# Patient Record
Sex: Female | Born: 1990 | Race: Black or African American | Hispanic: No | Marital: Single | Smoking: Current some day smoker
Health system: Southern US, Community
[De-identification: ages and names within clinical notes are randomized; demographics above are authoritative.]

## PROBLEM LIST (undated history)

## (undated) HISTORY — PX: MOUTH SURGERY: SHX715

---

## 2011-12-25 ENCOUNTER — Encounter (HOSPITAL_COMMUNITY): Payer: Self-pay | Admitting: Emergency Medicine

## 2011-12-25 ENCOUNTER — Emergency Department (HOSPITAL_COMMUNITY)
Admission: EM | Admit: 2011-12-25 | Discharge: 2011-12-25 | Disposition: A | Discharge status: Home / Self Care | Payer: No Typology Code available for payment source | Attending: Emergency Medicine | Admitting: Emergency Medicine

## 2011-12-25 ENCOUNTER — Emergency Department (HOSPITAL_COMMUNITY): Payer: No Typology Code available for payment source

## 2011-12-25 DIAGNOSIS — R51 Headache: Secondary | ICD-10-CM | POA: Insufficient documentation

## 2011-12-25 DIAGNOSIS — S0083XA Contusion of other part of head, initial encounter: Secondary | ICD-10-CM

## 2011-12-25 DIAGNOSIS — S339XXA Sprain of unspecified parts of lumbar spine and pelvis, initial encounter: Secondary | ICD-10-CM | POA: Insufficient documentation

## 2011-12-25 DIAGNOSIS — S0003XA Contusion of scalp, initial encounter: Secondary | ICD-10-CM | POA: Insufficient documentation

## 2011-12-25 DIAGNOSIS — M26629 Arthralgia of temporomandibular joint, unspecified side: Secondary | ICD-10-CM | POA: Insufficient documentation

## 2011-12-25 DIAGNOSIS — Z79899 Other long term (current) drug therapy: Secondary | ICD-10-CM | POA: Insufficient documentation

## 2011-12-25 DIAGNOSIS — M25569 Pain in unspecified knee: Secondary | ICD-10-CM | POA: Insufficient documentation

## 2011-12-25 DIAGNOSIS — S8000XA Contusion of unspecified knee, initial encounter: Secondary | ICD-10-CM | POA: Insufficient documentation

## 2011-12-25 DIAGNOSIS — S39012A Strain of muscle, fascia and tendon of lower back, initial encounter: Secondary | ICD-10-CM

## 2011-12-25 DIAGNOSIS — F17200 Nicotine dependence, unspecified, uncomplicated: Secondary | ICD-10-CM | POA: Insufficient documentation

## 2011-12-25 MED ORDER — NAPROXEN 500 MG PO TABS: 500.0000 mg | ORAL_TABLET | Freq: Two times a day (BID) | ORAL | Status: AC

## 2011-12-25 MED ORDER — IBUPROFEN 800 MG PO TABS
800.0000 mg | ORAL_TABLET | Freq: Once | ORAL | Status: AC
  Administered 2011-12-25: 800 mg via ORAL
  Filled 2011-12-25: qty 1

## 2011-12-25 MED ORDER — CYCLOBENZAPRINE HCL 10 MG PO TABS: 10.0000 mg | ORAL_TABLET | Freq: Two times a day (BID) | ORAL | Status: AC | PRN

## 2011-12-25 MED ORDER — OXYCODONE-ACETAMINOPHEN 5-325 MG PO TABS: 1.0000 | ORAL_TABLET | Freq: Four times a day (QID) | ORAL | Status: AC | PRN

## 2011-12-25 MED ORDER — OXYCODONE-ACETAMINOPHEN 5-325 MG PO TABS
2.0000 | ORAL_TABLET | Freq: Once | ORAL | Status: AC
  Administered 2011-12-25: 2 via ORAL
  Filled 2011-12-25: qty 2
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
## 2011-12-25 NOTE — ED Notes (Signed)
Dr. King at pt bedside.  

## 2013-11-17 ENCOUNTER — Emergency Department (HOSPITAL_COMMUNITY): Payer: No Typology Code available for payment source

## 2013-11-17 ENCOUNTER — Encounter (HOSPITAL_COMMUNITY): Payer: Self-pay | Admitting: Emergency Medicine

## 2013-11-17 ENCOUNTER — Emergency Department (HOSPITAL_COMMUNITY)
Admission: EM | Admit: 2013-11-17 | Discharge: 2013-11-17 | Disposition: A | Discharge status: Home / Self Care | Payer: No Typology Code available for payment source | Attending: Emergency Medicine | Admitting: Emergency Medicine

## 2013-11-17 DIAGNOSIS — S21109A Unspecified open wound of unspecified front wall of thorax without penetration into thoracic cavity, initial encounter: Secondary | ICD-10-CM | POA: Insufficient documentation

## 2013-11-17 DIAGNOSIS — S21222D Laceration with foreign body of left back wall of thorax without penetration into thoracic cavity, subsequent encounter: Secondary | ICD-10-CM

## 2013-11-17 DIAGNOSIS — S0993XA Unspecified injury of face, initial encounter: Secondary | ICD-10-CM | POA: Insufficient documentation

## 2013-11-17 DIAGNOSIS — S335XXA Sprain of ligaments of lumbar spine, initial encounter: Secondary | ICD-10-CM | POA: Insufficient documentation

## 2013-11-17 DIAGNOSIS — S41009A Unspecified open wound of unspecified shoulder, initial encounter: Secondary | ICD-10-CM | POA: Insufficient documentation

## 2013-11-17 DIAGNOSIS — S199XXA Unspecified injury of neck, initial encounter: Secondary | ICD-10-CM

## 2013-11-17 DIAGNOSIS — Z23 Encounter for immunization: Secondary | ICD-10-CM | POA: Insufficient documentation

## 2013-11-17 DIAGNOSIS — S21119A Laceration without foreign body of unspecified front wall of thorax without penetration into thoracic cavity, initial encounter: Secondary | ICD-10-CM

## 2013-11-17 DIAGNOSIS — S21009A Unspecified open wound of unspecified breast, initial encounter: Secondary | ICD-10-CM | POA: Insufficient documentation

## 2013-11-17 DIAGNOSIS — F17200 Nicotine dependence, unspecified, uncomplicated: Secondary | ICD-10-CM | POA: Insufficient documentation

## 2013-11-17 DIAGNOSIS — S39012A Strain of muscle, fascia and tendon of lower back, initial encounter: Secondary | ICD-10-CM

## 2013-11-17 DIAGNOSIS — S21209A Unspecified open wound of unspecified back wall of thorax without penetration into thoracic cavity, initial encounter: Secondary | ICD-10-CM | POA: Insufficient documentation

## 2013-11-17 MED ORDER — TETANUS-DIPHTH-ACELL PERTUSSIS 5-2.5-18.5 LF-MCG/0.5 IM SUSP
0.5000 mL | Freq: Once | INTRAMUSCULAR | Status: AC
  Administered 2013-11-17: 0.5 mL via INTRAMUSCULAR
  Filled 2013-11-17: qty 0.5

## 2013-11-17 MED ORDER — IBUPROFEN 800 MG PO TABS: 800.0000 mg | ORAL_TABLET | Freq: Three times a day (TID) | ORAL | Status: DC

## 2013-11-17 MED ORDER — ACETAMINOPHEN 325 MG PO TABS
650.0000 mg | ORAL_TABLET | Freq: Once | ORAL | Status: AC
  Administered 2013-11-17: 650 mg via ORAL
  Filled 2013-11-17: qty 2

## 2013-11-17 NOTE — Discharge Instructions (Signed)
Laceration Care, Adult °A laceration is a cut or lesion that goes through all layers of the skin and into the tissue just beneath the skin. °TREATMENT  °Some lacerations may not require closure. Some lacerations may not be able to be closed due to an increased risk of infection. It is important to see your caregiver as soon as possible after an injury to minimize the risk of infection and maximize the opportunity for successful closure. °If closure is appropriate, pain medicines may be given, if needed. The wound will be cleaned to help prevent infection. Your caregiver will use stitches (sutures), staples, wound glue (adhesive), or skin adhesive strips to repair the laceration. These tools bring the skin edges together to allow for faster healing and a better cosmetic outcome. However, all wounds will heal with a scar. Once the wound has healed, scarring can be minimized by covering the wound with sunscreen during the day for 1 full year. °HOME CARE INSTRUCTIONS  °For sutures or staples: °· Keep the wound clean and dry. °· If you were given a bandage (dressing), you should change it at least once a day. Also, change the dressing if it becomes wet or dirty, or as directed by your caregiver. °· Wash the wound with soap and water 2 times a day. Rinse the wound off with water to remove all soap. Pat the wound dry with a clean towel. °· After cleaning, apply a thin layer of the antibiotic ointment as recommended by your caregiver. This will help prevent infection and keep the dressing from sticking. °· You may shower as usual after the first 24 hours. Do not soak the wound in water until the sutures are removed. °· Only take over-the-counter or prescription medicines for pain, discomfort, or fever as directed by your caregiver. °· Get your sutures or staples removed as directed by your caregiver. °For skin adhesive strips: °· Keep the wound clean and dry. °· Do not get the skin adhesive strips wet. You may bathe  carefully, using caution to keep the wound dry. °· If the wound gets wet, pat it dry with a clean towel. °· Skin adhesive strips will fall off on their own. You may trim the strips as the wound heals. Do not remove skin adhesive strips that are still stuck to the wound. They will fall off in time. °For wound adhesive: °· You may briefly wet your wound in the shower or bath. Do not soak or scrub the wound. Do not swim. Avoid periods of heavy perspiration until the skin adhesive has fallen off on its own. After showering or bathing, gently pat the wound dry with a clean towel. °· Do not apply liquid medicine, cream medicine, or ointment medicine to your wound while the skin adhesive is in place. This may loosen the film before your wound is healed. °· If a dressing is placed over the wound, be careful not to apply tape directly over the skin adhesive. This may cause the adhesive to be pulled off before the wound is healed. °· Avoid prolonged exposure to sunlight or tanning lamps while the skin adhesive is in place. Exposure to ultraviolet light in the first year will darken the scar. °· The skin adhesive will usually remain in place for 5 to 10 days, then naturally fall off the skin. Do not pick at the adhesive film. °You may need a tetanus shot if: °· You cannot remember when you had your last tetanus shot. °· You have never had a tetanus   shot. If you get a tetanus shot, your arm may swell, get red, and feel warm to the touch. This is common and not a problem. If you need a tetanus shot and you choose not to have one, there is a rare chance of getting tetanus. Sickness from tetanus can be serious. SEEK MEDICAL CARE IF:   You have redness, swelling, or increasing pain in the wound.  You see a red line that goes away from the wound.  You have yellowish-white fluid (pus) coming from the wound.  You have a fever.  You notice a bad smell coming from the wound or dressing.  Your wound breaks open before or  after sutures have been removed.  You notice something coming out of the wound such as wood or glass.  Your wound is on your hand or foot and you cannot move a finger or toe. SEEK IMMEDIATE MEDICAL CARE IF:   Your pain is not controlled with prescribed medicine.  You have severe swelling around the wound causing pain and numbness or a change in color in your arm, hand, leg, or foot.  Your wound splits open and starts bleeding.  You have worsening numbness, weakness, or loss of function of any joint around or beyond the wound.  You develop painful lumps near the wound or on the skin anywhere on your body. MAKE SURE YOU:   Understand these instructions.  Will watch your condition.  Will get help right away if you are not doing well or get worse. Document Released: 10/31/2005 Document Revised: 01/23/2012 Document Reviewed: 04/26/2011 [facility] Patient Information 2014 [place], [place].  Lumbosacral Strain Lumbosacral strain is one of the most common causes of back pain. There are many causes of back pain. Most are not serious conditions. CAUSES  Your backbone (spinal column) is made up of 24 main vertebral bodies, the sacrum, and the coccyx. These are held together by muscles and tough, fibrous tissue (ligaments). Nerve roots pass through the openings between the vertebrae. A sudden move or injury to the back may cause injury to, or pressure on, these nerves. This may result in localized back pain or pain movement (radiation) into the buttocks, down the leg, and into the foot. Sharp, shooting pain from the buttock down the back of the leg (sciatica) is frequently associated with a ruptured (herniated) disk. Pain may be caused by muscle spasm alone. Your caregiver can often find the cause of your pain by the details of your symptoms and an exam. In some cases, you may need tests (such as X-rays). Your caregiver will work with you to decide if any tests are needed based on your specific  exam. HOME CARE INSTRUCTIONS   Avoid an underactive lifestyle. Active exercise, as directed by your caregiver, is your greatest weapon against back pain.  Avoid hard physical activities (tennis, racquetball, waterskiing) if you are not in proper physical condition for it. This may aggravate or create problems.  If you have a back problem, avoid sports requiring sudden body movements. Swimming and walking are generally safer activities.  Maintain good posture.  Avoid becoming overweight (obese).  Use bed rest for only the most extreme, sudden (acute) episode. Your caregiver will help you determine how much bed rest is necessary.  For acute conditions, you may put ice on the injured area.  Put ice in a plastic bag.  Place a towel between your skin and the bag.  Leave the ice on for 15-20 minutes at a time, every 2 hours,  or as needed.  After you are improved and more active, it may help to apply heat for 30 minutes before activities. See your caregiver if you are having pain that lasts longer than expected. Your caregiver can advise appropriate exercises or therapy if needed. With conditioning, most back problems can be avoided. SEEK IMMEDIATE MEDICAL CARE IF:   You have numbness, tingling, weakness, or problems with the use of your arms or legs.  You experience severe back pain not relieved with medicines.  There is a change in bowel or bladder control.  You have increasing pain in any area of the body, including your belly (abdomen).  You notice shortness of breath, dizziness, or feel faint.  You feel sick to your stomach (nauseous), are throwing up (vomiting), or become sweaty.  You notice discoloration of your toes or legs, or your feet get very cold.  Your back pain is getting worse.  You have a fever. MAKE SURE YOU:   Understand these instructions.  Will watch your condition.  Will get help right away if you are not doing well or get worse. Document Released:  08/10/2005 Document Revised: 01/23/2012 Document Reviewed: 01/30/2009 [facility] Patient Information 2014 [place] [employer]
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
## 2013-11-30 NOTE — ED Provider Notes (Signed)
Medical screening examination/treatment/procedure(s) were performed by non-physician practitioner and as supervising physician I was immediately available for consultation/collaboration.  EKG Interpretation   None         Twana Wileman, MD 11/30/13 0647 

## 2014-11-25 ENCOUNTER — Encounter (HOSPITAL_COMMUNITY): Payer: Self-pay | Admitting: Emergency Medicine

## 2014-11-25 ENCOUNTER — Emergency Department (HOSPITAL_COMMUNITY)
Admission: EM | Admit: 2014-11-25 | Discharge: 2014-11-25 | Disposition: A | Discharge status: Home / Self Care | Payer: No Typology Code available for payment source | Attending: Emergency Medicine | Admitting: Emergency Medicine

## 2014-11-25 DIAGNOSIS — M5442 Lumbago with sciatica, left side: Secondary | ICD-10-CM

## 2014-11-25 DIAGNOSIS — Z720 Tobacco use: Secondary | ICD-10-CM | POA: Insufficient documentation

## 2014-11-25 DIAGNOSIS — Z9889 Other specified postprocedural states: Secondary | ICD-10-CM | POA: Insufficient documentation

## 2014-11-25 MED ORDER — CYCLOBENZAPRINE HCL 10 MG PO TABS: 10.0000 mg | ORAL_TABLET | Freq: Two times a day (BID) | ORAL | Status: DC | PRN

## 2014-11-25 MED ORDER — IBUPROFEN 800 MG PO TABS: 800.0000 mg | ORAL_TABLET | Freq: Three times a day (TID) | ORAL | Status: DC

## 2014-11-25 NOTE — ED Provider Notes (Signed)
CSN: [id number]     Arrival date & time 11/25/14  1309 History  This chart was scribed for non-physician practitioner, Ladona MowJoe Marthe Dant, PA-C working with Ethelda ChickMartha K Linker, MD by Greggory StallionKayla Andersen, ED scribe. This patient was seen in room WTR7/WTR7 and the patient's care was started at 2:23 PM.   Chief Complaint  Patient presents with  . Back Pain   The history is provided by the patient. No language interpreter was used.    HPI Comments: Anita Rush is a 24 y.o. female with history of slipped disc who presents to the Emergency Department complaining of worsening lower back pain with associated tingling into bilateral legs that started 3 months ago. States she was diagnosed with a slipped disc one year ago and has intermittent pain since. Denies recent injury but states she works 2 jobs and has to be on her feet a lot. certain movements worsen pain. Denies fever, bowel or bladder incontinence, saddle anesthesia, numbness, weakness. Denies history of cancer or IV drug use.   History reviewed. No pertinent past medical history. Past Surgical History  Procedure Laterality Date  . Mouth surgery     No family history on file. History  Substance Use Topics  . Smoking status: Current Every Day Smoker -- 0.50 packs/day  . Smokeless tobacco: Not on file  . Alcohol Use: No   OB History    No data available     Review of Systems  Constitutional: Negative for fever.  Genitourinary:       Negative for bowel or bladder incontinence.  Musculoskeletal: Positive for back pain.  Neurological: Negative for weakness and numbness.  All other systems reviewed and are negative.  Allergies  Review of patient's allergies indicates no known allergies.  Home Medications   Prior to Admission medications   Medication Sig Start Date End Date Taking? Authorizing Provider  cyclobenzaprine (FLEXERIL) 10 MG tablet Take 1 tablet (10 mg total) by mouth 2 (two) times daily as needed for muscle spasms. 11/25/14   Monte FantasiaJoseph W  Marcele Kosta, PA-C  ibuprofen (ADVIL,MOTRIN) 800 MG tablet Take 1 tablet (800 mg total) by mouth 3 (three) times daily. 11/25/14   Monte FantasiaJoseph W Cerena Baine, PA-C   BP 125/73 mmHg  Pulse 94  Temp(Src) 98 F (36.7 C) (Oral)  Resp 20  SpO2 97%  LMP 11/17/2014 (Exact Date)   Physical Exam  Constitutional: She is oriented to person, place, and time. She appears well-developed and well-nourished. No distress.  HENT:  Head: Normocephalic and atraumatic.  Eyes: Conjunctivae and EOM are normal.  Neck: Neck supple. No tracheal deviation present.  Cardiovascular: Normal rate.   Pulmonary/Chest: Effort normal. No respiratory distress.  Musculoskeletal: Normal range of motion.  Tenderness to palpation of left SI joint region.   Neurological: She is alert and oriented to person, place, and time. She has normal strength. No cranial nerve deficit or sensory deficit. She displays a negative Romberg sign. Gait normal. GCS eye subscore is 4. GCS verbal subscore is 5. GCS motor subscore is 6.  Patient fully alert answering questions appropriately in full, clear sentences. Cranial nerve II through XII grossly intact. Motor strength 5/5 in bilateral upper and lower extremities. Distal sensation intact.  Skin: Skin is warm and dry.  Psychiatric: She has a normal mood and affect. Her behavior is normal.  Nursing note and vitals reviewed.  ED Course  Procedures (including critical care time)  DIAGNOSTIC STUDIES: Oxygen Saturation is 97% on RA, normal by my interpretation.    COORDINATION  OF CARE: 2:28 PM-Discussed treatment plan which includes 800 mg ibuprofen, a muscle relaxer, back exercises and heating pad with pt at bedside and pt agreed to plan. Return precautions given.  Labs Review Labs Reviewed - No data to display  Imaging Review No results found.   EKG Interpretation None      MDM   Final diagnoses:  Left-sided low back pain with left-sided sciatica    Patient with back pain.  No neurological  deficits and normal neuro exam.  Patient can walk but states is painful.  No loss of bowel or bladder control.  No concern for cauda equina.  No fever, night sweats, weight loss, h/o cancer, IVDU.  RICE protocol and pain medicine indicated and discussed with patient. I discussed return precautions with patient, and strongly encouraged her to follow-up with primary care physician. Patient was understanding and agreement of this plan. I encouraged patient to call or return to the ER should she have any questions or concerns.  I personally performed the services described in this documentation, which was scribed in my presence. The recorded information has been reviewed and is accurate.  BP 125/73 mmHg  Pulse 94  Temp(Src) 98 F (36.7 C) (Oral)  Resp 20  SpO2 97%  LMP 11/17/2014 (Exact Date)  Signed,  Ladona Mow, PA-C 9:49 PM   Monte Fantasia, PA-C 11/25/14 2149  Ethelda Chick, MD 11/28/14 [phone]

## 2014-11-25 NOTE — Discharge Instructions (Signed)
Sciatica °Sciatica is pain, weakness, numbness, or tingling along the path of the sciatic nerve. The nerve starts in the lower back and runs down the back of each leg. The nerve controls the muscles in the lower leg and in the back of the knee, while also providing sensation to the back of the thigh, lower leg, and the sole of your foot. Sciatica is a symptom of another medical condition. For instance, nerve damage or certain conditions, such as a herniated disk or bone spur on the spine, pinch or put pressure on the sciatic nerve. This causes the pain, weakness, or other sensations normally associated with sciatica. Generally, sciatica only affects one side of the body. °CAUSES  °· Herniated or slipped disc. °· Degenerative disk disease. °· A pain disorder involving the narrow muscle in the buttocks (piriformis syndrome). °· Pelvic injury or fracture. °· Pregnancy. °· Tumor (rare). °SYMPTOMS  °Symptoms can vary from mild to very severe. The symptoms usually travel from the low back to the buttocks and down the back of the leg. Symptoms can include: °· Mild tingling or dull aches in the lower back, leg, or hip. °· Numbness in the back of the calf or sole of the foot. °· Burning sensations in the lower back, leg, or hip. °· Sharp pains in the lower back, leg, or hip. °· Leg weakness. °· Severe back pain inhibiting movement. °These symptoms may get worse with coughing, sneezing, laughing, or prolonged sitting or standing. Also, being overweight may worsen symptoms. °DIAGNOSIS  °Your caregiver will perform a physical exam to look for common symptoms of sciatica. He or she may ask you to do certain movements or activities that would trigger sciatic nerve pain. Other tests may be performed to find the cause of the sciatica. These may include: °· Blood tests. °· X-rays. °· Imaging tests, such as an MRI or CT scan. °TREATMENT  °Treatment is directed at the cause of the sciatic pain. Sometimes, treatment is not necessary  and the pain and discomfort goes away on its own. If treatment is needed, your caregiver may suggest: °· Over-the-counter medicines to relieve pain. °· Prescription medicines, such as anti-inflammatory medicine, muscle relaxants, or narcotics. °· Applying heat or ice to the painful area. °· Steroid injections to lessen pain, irritation, and inflammation around the nerve. °· Reducing activity during periods of pain. °· Exercising and stretching to strengthen your abdomen and improve flexibility of your spine. Your caregiver may suggest losing weight if the extra weight makes the back pain worse. °· Physical therapy. °· Surgery to eliminate what is pressing or pinching the nerve, such as a bone spur or part of a herniated disk. °HOME CARE INSTRUCTIONS  °· Only take over-the-counter or prescription medicines for pain or discomfort as directed by your caregiver. °· Apply ice to the affected area for 20 minutes, 3-4 times a day for the first 48-72 hours. Then try heat in the same way. °· Exercise, stretch, or perform your usual activities if these do not aggravate your pain. °· Attend physical therapy sessions as directed by your caregiver. °· Keep all follow-up appointments as directed by your caregiver. °· Do not wear high heels or shoes that do not provide proper support. °· Check your mattress to see if it is too soft. A firm mattress may lessen your pain and discomfort. °SEEK IMMEDIATE MEDICAL CARE IF:  °· You lose control of your bowel or bladder (incontinence). °· You have increasing weakness in the lower back, pelvis, buttocks,   or legs. °· You have redness or swelling of your back. °· You have a burning sensation when you urinate. °· You have pain that gets worse when you lie down or awakens you at night. °· Your pain is worse than you have experienced in the past. °· Your pain is lasting longer than 4 weeks. °· You are suddenly losing weight without reason. °MAKE SURE YOU: °· Understand these  instructions. °· Will watch your condition. °· Will get help right away if you are not doing well or get worse. °Document Released: 10/25/2001 Document Revised: 05/01/2012 Document Reviewed: 03/11/2012 °ExitCare® Patient Information ©2015 [employer]. This information is not intended to replace advice given to you by your health care provider. Make sure you discuss any questions you have with your health care provider. ° ° °Back Exercises °Back exercises help treat and prevent back injuries. The goal of back exercises is to increase the strength of your abdominal and back muscles and the flexibility of your back. These exercises should be started when you no longer have back pain. Back exercises include: °· Pelvic Tilt. Lie on your back with your knees bent. Tilt your pelvis until the lower part of your back is against the floor. Hold this position 5 to 10 sec and repeat 5 to 10 times. °· Knee to Chest. Pull first 1 knee up against your chest and hold for 20 to 30 seconds, repeat this with the other knee, and then both knees. This may be done with the other leg straight or bent, whichever feels better. °· Sit-Ups or Curl-Ups. Bend your knees 90 degrees. Start with tilting your pelvis, and do a partial, slow sit-up, lifting your trunk only 30 to 45 degrees off the floor. Take at least 2 to 3 seconds for each sit-up. Do not do sit-ups with your knees out straight. If partial sit-ups are difficult, simply do the above but with only tightening your abdominal muscles and holding it as directed. °· Hip-Lift. Lie on your back with your knees flexed 90 degrees. Push down with your feet and shoulders as you raise your hips a couple inches off the floor; hold for 10 seconds, repeat 5 to 10 times. °· Back arches. Lie on your stomach, propping yourself up on bent elbows. Slowly press on your hands, causing an arch in your low back. Repeat 3 to 5 times. Any initial stiffness and discomfort should lessen with repetition over  time. °· Shoulder-Lifts. Lie face down with arms beside your body. Keep hips and torso pressed to floor as you slowly lift your head and shoulders off the floor. °Do not overdo your exercises, especially in the beginning. Exercises may cause you some mild back discomfort which lasts for a few minutes; however, if the pain is more severe, or lasts for more than 15 minutes, do not continue exercises until you see your caregiver. Improvement with exercise therapy for back problems is slow.  °See your caregivers for assistance with developing a proper back exercise program. °Document Released: 12/08/2004 Document Revised: 01/23/2012 Document Reviewed: 09/01/2011 °ExitCare® Patient Information ©2015 [employer]. This information is not intended to replace advice given to you by your health care provider. Make sure you discuss any questions you have with your health care provider. ° ° °Emergency Department Resource Guide °1) Find a Doctor and Pay Out of Pocket °Although you won't have to find out who is covered by your insurance plan, it is a good idea to ask around and get recommendations. You   will then need to call the office and see if the doctor you have chosen will accept you as a new patient and what types of options they offer for patients who are self-pay. Some doctors offer discounts or will set up payment plans for their patients who do not have insurance, but you will need to ask so you aren't surprised when you get to your appointment. ° °2) Contact Your Local Health Department °Not all health departments have doctors that can see patients for sick visits, but many do, so it is worth a call to see if yours does. If you don't know where your local health department is, you can check in your phone book. The CDC also has a tool to help you locate your state's health department, and many state websites also have listings of all of their local health departments. ° °3) Find a Walk-in Clinic °If your illness is  not likely to be very severe or complicated, you may want to try a walk in clinic. These are popping up all over the country in pharmacies, drugstores, and shopping centers. They're usually staffed by nurse practitioners or physician assistants that have been trained to treat common illnesses and complaints. They're usually fairly quick and inexpensive. However, if you have serious medical issues or chronic medical problems, these are probably not your best option. ° °No Primary Care Doctor: °- Call [facility] at  832-8000 - they can help you locate a primary care doctor that  accepts your insurance, provides certain services, etc. °- Physician Referral Service- 1-800-533-3463 ° °Chronic Pain Problems: °Organization         Address  Phone   Notes  °[facility]  (336) 297-2271 Patients need to be referred by their primary care doctor.  ° °Medication Assistance: °Organization         Address  Phone   Notes  °[place] [facility] [address] °[place], [place] [address] (336) 641-8030 --Must be a resident of [place] °-- Must have NO insurance coverage whatsoever (no Medicaid/ Medicare, etc.) °-- The pt. MUST have a primary care doctor that directs their care regularly and follows them in the community °  °[facility]  (866) 331-1348   °[employer]  (888) 892-1162   ° °Agencies that provide inexpensive medical care: °Organization         Address  Phone   Notes  °[facility]  (336) 832-8035   °[facility]    (336) 832-7272   °[facility] [address] °[place], [place] [address] (336) 832-4777   °[facility] [address], °[place] (336) 271-4999   °[facility]    (336) 373-0678   °[facility]    (336) 272-1050   °[facility] ° [address], [place] Phone:  (336) 832-4444, Fax:  (336) 832-4440 Hours of Operation:  9 am - 6  pm, M-F.  Also accepts Medicaid/Medicare and self-pay.  °[facility] ° [address], [place] Phone: (336) 832-3150, Fax: (336) [phone]. Hours of Operation:  8:30 am - 5:30 pm, M-F.  Also accepts Medicaid and self-pay.  °[facility] [place] [address], [place] Phone: (336) 878-6027   °[facility] [address], [place], [place] (336)723-1848, Ext. 123 Mondays & Thursdays: 7-9 AM.  First 15 patients are seen on a first come, first   serve basis. °  ° °Medicaid-accepting [place] Providers: ° °Organization         Address  Phone   Notes  °[facility] [address], [place] (336) 641-2100 Also accepts self-pay patients.  °[facility] [address], [place] ° (336) 856-9996   °[facility] [address], [place] (336) 288-8857   °[facility] [address] [place] [address], [place] (336) 299-7000   °Veita Bland [address], [place]  ° (336) 373-1557 Only accepts [place] Access Medicaid patients after they have their name applied to their card.  ° °Self-Pay (no insurance) in [place]: ° °Organization         Address  Phone   Notes  °Sickle Cell Patients, [facility] [address], [place] (336) 832-1970   °[facility] [address], [place] (336) 832-4400   °[facility] [place] ° [address], [place] (336) 992-4800   °[facility]/Dr. Osei-Bonsu ° [address] [place] [address], [place] or [address], [place] (336) 841-8500 Phone number for both [place] and [place] locations is the same.  °[facility] [address], [place] (336) 299-0000   °[facility] [address] [place] [address], [place] or [address] (336) 852-7530 °(336) 878-2260   °[facility] [address], [place] (336) 350-1642, phone; (336) 294-5005, fax Sees patients 1st and 3rd Saturday of every month.  Must not qualify for public or private insurance (i.e. Medicaid, Medicare, [place] Health Choice, Veterans' Benefits) • Household income should be no more than 200% of the poverty level •The clinic cannot treat you if you are pregnant or think you are pregnant • Sexually transmitted diseases are not treated at the clinic.  ° ° °Dental Care: °Organization         Address  Phone  Notes  °[facility] [address], [place] (336) 641-6152 Accepts children up to age 21 who are enrolled in Medicaid or [place] Health Choice; pregnant women with a Medicaid card; and children who have applied for Medicaid or [place] Health Choice, but were declined, whose parents can pay a reduced fee at time of service.  °[facility] [place]  [address], [place] (336) 641-7733 Accepts children up to age 21 who are enrolled in Medicaid or [place] Health Choice; pregnant women with a Medicaid card; and children who have applied for Medicaid or [place] Health Choice, but were declined, whose parents can pay a reduced fee at time of service.  °[facility] ° [address], [place] (336) 641-4533 Patients are seen by appointment only. Walk-ins are not accepted. [facility] will see patients 18 years of age and older. °Monday - Tuesday (8am-5pm) °Most Wednesdays (8:30-5pm) °$30 per visit, cash only  °[facility] ° [address], [place] (336) 641-4533 Patients are seen by appointment only. Walk-ins are not accepted. [facility] will see patients 18 years of age and older. °One Wednesday Evening (Monthly: Volunteer Based).  $30 per visit, cash only  °[facility]  (919) 537-3737 for adults; Children under age 4, call Graduate Pediatric Dentistry at (919)  [phone]. Children aged 4-14, please call (  [phone]) [phone] to request a pediatric application. ° Dental services are provided in all areas of dental care including fillings, crowns and bridges, complete and partial dentures, implants, gum treatment, root canals, and extractions. Preventive care is also provided. Treatment is provided to both adults and children. °Patients are selected via a lottery and there is often a waiting list. °  °[facility] [address], °[place] ° (336) 763-8833 [email or web address] °  °[facility] [address], [place], [place] (336)723-1848, Ext. 123 Second and Fourth Thursday of each month, opens at 6:30 AM; Clinic ends at 9 AM.  Patients are seen on a first-come first-served basis, and a limited number are seen during each clinic.  ° °[facility] ° [address], [place], [place] (336) 723-7904   Eligibility Requirements °You must have lived in [place], or [place] counties for at least the last three months. °  You cannot be eligible for state or federal sponsored healthcare insurance, including Veterans Administration, Medicaid, or Medicare. °  You generally cannot be eligible for healthcare insurance through your employer.  °  How to apply: °Eligibility screenings are held every Tuesday and Wednesday afternoon from 1:00 pm until 4:00 pm. You do not need an appointment for the interview!  °[facility] [address], [place], [place] 336-631-2330   °[facility]  336-342-8273   °[facility]  336-703-3100   °[facility]  336-570-6415   ° °Behavioral Health Resources in the Community: °Intensive Outpatient Programs °Organization         Address  Phone  Notes  °[facility] [address], [place], [place] 336-878-6098   °[facility] [address], [place], [place] 336-832-9800   °[facility]  [address], [place], [place] ° 336-882-2125   °[facility] [address],  °[place], [place] 1-800-853-5163 or 336-641-4981   °Substance Abuse Resources °Organization         Address  Phone  Notes  °[facility]  336-882-2125   °[facility]  336-784-9470   °[facility]  336-285-9073   °[facility]  336-845-3988   °Residential & Outpatient Substance Abuse Program  1-800-659-3381   °Psychological Services °Organization         Address  Phone  Notes  °[facility]  336- 832-9600   °[facility]  336- 378-7881   °[facility] [address], [place] 1-800-853-5163 or 336-641-4981   ° °Mobile Crisis Teams °Organization         Address  Phone  Notes  °[facility], Mobile Crisis Care Unit  1-877-626-1772   °Assertive °[facility] ° [address] [place], [place] 336-834-9664   °Sharon DeEsch [address] °[place] 336-554-5454   ° °Self-Help/Support Groups °Organization         Address  Phone             Notes  °[facility] - variety of support groups  336- 373-1402 Call for more information  °Narcotics Anonymous (NA), [facility] [address], °[place]  2 meetings at this location  ° °Residential Treatment Programs °Organization         Address  Phone  Notes  °[facility] [address],    °   1-866-801-8205   °[facility] °   [address], [place], [place] 704-293-8524   °[facility] [address], [place] 336-845-3988 Admissions: 8am-3pm M-F  °[facility] [address]    °[place], [place] 336-841-1104   °[facility] [address], [place], [place] 336-379-7146   °[facility] [address]  °[place], [place] 336-285-9073   °[facility] Intensive Outpatient [address], [place], [place]  336-852-3033   °[facility] [address]  °[place], [place] 1-877-615-2722 or 336-784-9470   °[facility] [address] , [place] 336-227-7417 Accepts Medicaid  °[facility] [address]  °[place] 1-800-659-3381 Substance Abuse/Addiction Treatment  ° °[place] Behavioral Health Resources °Organization         Address  Phone  Notes  °[facility]  (888) 581-9988   °Julie Brannon, PhD [address] [place], [place]   (336) 349-5553 or (336) 951-0000   °[facility]   [address] °[place], [place] (336) 349-4454   °[facility] [address], [place], [place] (336) 342-8316 Insurance/Medicaid/sponsorship through [facility]  °[facility] [address]                                    [place], [place] (336) 342-8316 Therapy/tele-psych/case  °[facility] [address]  ° [place], [place] (336) 349-2233    °Dr. Arfeen  (336) 349-4544   °[facility] [place]  [employer] [place] [facility] 1) [address], [place] °2) [address], [place] °3)  [address], [place] (336) 349-3220 °(336) 342-7768 ° °(336) 342-8140   °[place] [facility] (336) 342-1394 or (336) 342-3537 (After Hours)    ° ° ° °

## 2014-11-25 NOTE — ED Notes (Signed)
Pt states she has slipped disc in back x 1 year and c/o back pain.

## 2014-12-10 ENCOUNTER — Encounter (HOSPITAL_COMMUNITY): Payer: Self-pay | Admitting: *Deleted

## 2014-12-10 ENCOUNTER — Emergency Department (HOSPITAL_COMMUNITY)
Admission: EM | Admit: 2014-12-10 | Discharge: 2014-12-10 | Disposition: A | Discharge status: Home / Self Care | Payer: No Typology Code available for payment source | Attending: Emergency Medicine | Admitting: Emergency Medicine

## 2014-12-10 ENCOUNTER — Emergency Department (HOSPITAL_COMMUNITY): Payer: No Typology Code available for payment source

## 2014-12-10 DIAGNOSIS — Y92410 Unspecified street and highway as the place of occurrence of the external cause: Secondary | ICD-10-CM | POA: Diagnosis not present

## 2014-12-10 DIAGNOSIS — Z3202 Encounter for pregnancy test, result negative: Secondary | ICD-10-CM | POA: Insufficient documentation

## 2014-12-10 DIAGNOSIS — Z720 Tobacco use: Secondary | ICD-10-CM | POA: Insufficient documentation

## 2014-12-10 DIAGNOSIS — Y9389 Activity, other specified: Secondary | ICD-10-CM | POA: Insufficient documentation

## 2014-12-10 DIAGNOSIS — Z791 Long term (current) use of non-steroidal anti-inflammatories (NSAID): Secondary | ICD-10-CM | POA: Insufficient documentation

## 2014-12-10 DIAGNOSIS — Z79899 Other long term (current) drug therapy: Secondary | ICD-10-CM | POA: Insufficient documentation

## 2014-12-10 DIAGNOSIS — S3991XA Unspecified injury of abdomen, initial encounter: Secondary | ICD-10-CM | POA: Insufficient documentation

## 2014-12-10 DIAGNOSIS — Y998 Other external cause status: Secondary | ICD-10-CM | POA: Diagnosis not present

## 2014-12-10 DIAGNOSIS — S199XXA Unspecified injury of neck, initial encounter: Secondary | ICD-10-CM | POA: Diagnosis not present

## 2014-12-10 LAB — POC URINE PREG, ED: Preg Test, Ur: NEGATIVE

## 2014-12-10 MED ORDER — CYCLOBENZAPRINE HCL 10 MG PO TABS: 10.0000 mg | ORAL_TABLET | Freq: Two times a day (BID) | ORAL | Status: DC | PRN

## 2014-12-10 MED ORDER — IBUPROFEN 800 MG PO TABS: 800.0000 mg | ORAL_TABLET | Freq: Three times a day (TID) | ORAL | Status: DC
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
## 2014-12-10 NOTE — Discharge Instructions (Signed)

## 2015-01-26 ENCOUNTER — Encounter (HOSPITAL_COMMUNITY): Payer: Self-pay | Admitting: Emergency Medicine

## 2015-01-26 DIAGNOSIS — R0789 Other chest pain: Secondary | ICD-10-CM | POA: Insufficient documentation

## 2015-01-26 DIAGNOSIS — Z720 Tobacco use: Secondary | ICD-10-CM | POA: Insufficient documentation

## 2015-01-26 DIAGNOSIS — Z3202 Encounter for pregnancy test, result negative: Secondary | ICD-10-CM | POA: Insufficient documentation

## 2015-01-26 DIAGNOSIS — M791 Myalgia: Secondary | ICD-10-CM | POA: Insufficient documentation

## 2015-01-26 DIAGNOSIS — R109 Unspecified abdominal pain: Secondary | ICD-10-CM | POA: Insufficient documentation

## 2015-01-26 NOTE — ED Notes (Signed)
Pt presents with right sided flank pain since Thursday, denies pain with urination or N/V.  Pt also c/o left sided chest pain that has been intermittent X 2 days, admits pain is worse when she takes a deep breath.  Respirations e/u at present, no distress noted.

## 2015-01-27 ENCOUNTER — Emergency Department (HOSPITAL_COMMUNITY): Payer: No Typology Code available for payment source

## 2015-01-27 ENCOUNTER — Emergency Department (HOSPITAL_COMMUNITY)
Admission: EM | Admit: 2015-01-27 | Discharge: 2015-01-27 | Disposition: A | Discharge status: Home / Self Care | Payer: No Typology Code available for payment source | Attending: Emergency Medicine | Admitting: Emergency Medicine

## 2015-01-27 DIAGNOSIS — M7918 Myalgia, other site: Secondary | ICD-10-CM

## 2015-01-27 DIAGNOSIS — R109 Unspecified abdominal pain: Secondary | ICD-10-CM

## 2015-01-27 DIAGNOSIS — R0789 Other chest pain: Secondary | ICD-10-CM

## 2015-01-27 LAB — BASIC METABOLIC PANEL
Anion gap: 6 (ref 5–15)
BUN: 12 mg/dL (ref 6–23)
CO2: 29 mmol/L (ref 19–32)
Calcium: 9 mg/dL (ref 8.4–10.5)
Chloride: 104 mmol/L (ref 96–112)
Creatinine, Ser: 0.84 mg/dL (ref 0.50–1.10)
GFR calc Af Amer: >90 mL/min (ref >90)
GFR calc non Af Amer: >90 mL/min (ref >90)
Glucose, Bld: 91 mg/dL (ref 70–99)
Potassium: 3.8 mmol/L (ref 3.5–5.1)
Sodium: 139 mmol/L (ref 135–145)

## 2015-01-27 LAB — CBC
HCT: 43.9 % (ref 36.0–46.0)
Hemoglobin: 14.7 g/dL (ref 12.0–15.0)
MCH: 30.6 pg (ref 26.0–34.0)
MCHC: 33.5 g/dL (ref 30.0–36.0)
MCV: 91.5 fL (ref 78.0–100.0)
Platelets: 311 10*3/uL (ref 150–400)
RBC: 4.8 MIL/uL (ref 3.87–5.11)
RDW: 12.6 % (ref 11.5–15.5)
WBC: 13.1 10*3/uL — ABNORMAL HIGH (ref 4.0–10.5)

## 2015-01-27 LAB — I-STAT TROPONIN, ED: Troponin i, poc: 0 ng/mL (ref 0.00–0.08)

## 2015-01-27 LAB — PREGNANCY, URINE: Preg Test, Ur: NEGATIVE

## 2015-01-27 LAB — URINALYSIS, ROUTINE W REFLEX MICROSCOPIC
Bilirubin Urine: NEGATIVE
Glucose, UA: NEGATIVE mg/dL
Hgb urine dipstick: NEGATIVE
Ketones, ur: NEGATIVE mg/dL
Leukocytes, UA: NEGATIVE
Nitrite: NEGATIVE
Protein, ur: NEGATIVE mg/dL
Specific Gravity, Urine: 1.025 (ref 1.005–1.030)
Urobilinogen, UA: 1 mg/dL (ref 0.0–1.0)
pH: 6.5 (ref 5.0–8.0)

## 2015-01-27 MED ORDER — NAPROXEN 500 MG PO TABS: 500.0000 mg | ORAL_TABLET | Freq: Two times a day (BID) | ORAL | Status: DC

## 2015-01-27 MED ORDER — NAPROXEN 250 MG PO TABS
500.0000 mg | ORAL_TABLET | Freq: Once | ORAL | Status: AC
  Administered 2015-01-27: 500 mg via ORAL
  Filled 2015-01-27: qty 2

## 2015-01-27 MED ORDER — TRAMADOL HCL 50 MG PO TABS
50.0000 mg | ORAL_TABLET | Freq: Once | ORAL | Status: AC
  Administered 2015-01-27: 50 mg via ORAL
  Filled 2015-01-27: qty 1

## 2015-01-27 MED ORDER — TRAMADOL HCL 50 MG PO TABS: 50.0000 mg | ORAL_TABLET | Freq: Four times a day (QID) | ORAL | Status: DC | PRN

## 2015-01-27 NOTE — ED Provider Notes (Signed)
CSN: [id number]     Arrival date & time 01/26/15  2333 History   First MD Initiated Contact with Patient 01/27/15 [phone]     Chief Complaint  Patient presents with  . Flank Pain  . Chest Pain     (Consider location/radiation/quality/duration/timing/severity/associated sxs/prior Treatment) HPI 24 yo female presents to the ER from home with complaint of right flank pain and left lower rib pain.  Flank pain started 5 days ago, rib pain 2 days ago.  Pain is constant, like a runner's cramp.  No new activity or known trauma.  Pt works in The[employer], occupational.  No chest pain, shortness of breath, fever, chills, nausea, vomiting, diarrhea.  No vaginal discharge or urinary symptoms.  Pt took ibuprofen once without improvement History reviewed. No pertinent past medical history. Past Surgical History  Procedure Laterality Date  . Mouth surgery     No family history on file. History  Substance Use Topics  . Smoking status: Current Every Day Smoker -- 0.50 packs/day  . Smokeless tobacco: Not on file  . Alcohol Use: No   OB History    No data available     Review of Systems  Cardiovascular: Positive for chest pain.  Genitourinary: Positive for flank pain.  All other systems reviewed and are negative.     Allergies  Review of patient's allergies indicates no known allergies.  Home Medications   Prior to Admission medications   Medication Sig Start Date End Date Taking? Authorizing Provider  naproxen (NAPROSYN) 500 MG tablet Take 1 tablet (500 mg total) by mouth 2 (two) times daily with a meal. 01/27/15   Marisa Severin, MD  traMADol (ULTRAM) 50 MG tablet Take 1 tablet (50 mg total) by mouth every 6 (six) hours as needed for moderate pain. 01/27/15   Marisa Severin, MD   BP 115/59 mmHg  Pulse 66  Temp(Src) 97.9 F (36.6 C) (Oral)  Resp 18  SpO2 100%  LMP 12/30/2014 Physical Exam  Constitutional: She is oriented to person, place, and time. She appears well-developed and well-nourished.   HENT:  Head: Normocephalic and atraumatic.  Nose: Nose normal.  Mouth/Throat: Oropharynx is clear and moist.  Eyes: Conjunctivae and EOM are normal. Pupils are equal, round, and reactive to light.  Neck: Normal range of motion. Neck supple. No JVD present. No tracheal deviation present. No thyromegaly present.  Cardiovascular: Normal rate, regular rhythm, normal heart sounds and intact distal pulses.  Exam reveals no gallop and no friction rub.   No murmur heard. Pulmonary/Chest: Effort normal and breath sounds normal. No stridor. No respiratory distress. She has no wheezes. She has no rales. She exhibits tenderness (ttp along left lower costal margin without crepitius.).  Abdominal: Soft. Bowel sounds are normal. She exhibits no distension and no mass. There is tenderness (ttp along right flank, no RUQ tenderness, no RLQ tenderness.  No CVA tenderness). There is no rebound and no guarding.  Musculoskeletal: Normal range of motion. She exhibits no edema or tenderness.  Lymphadenopathy:    She has no cervical adenopathy.  Neurological: She is alert and oriented to person, place, and time. She displays normal reflexes. She exhibits normal muscle tone. Coordination normal.  Skin: Skin is warm and dry. No rash noted. No erythema. No pallor.  Psychiatric: She has a normal mood and affect. Her behavior is normal. Judgment and thought content normal.  Nursing note and vitals reviewed.   ED Course  Procedures (including critical care time) Labs Review Labs Reviewed  CBC - Abnormal; Notable for the following:    WBC 13.1 (*)    All other components within normal limits  BASIC METABOLIC PANEL  URINALYSIS, ROUTINE W REFLEX MICROSCOPIC  PREGNANCY, URINE  I-STAT TROPOININ, ED    Imaging Review Dg Chest 2 View  01/27/2015   CLINICAL DATA:  Right lower lateral chest pain for several days  EXAM: CHEST  2 VIEW  COMPARISON:  11/17/2013  FINDINGS: The heart size and mediastinal contours are within  normal limits. Both lungs are clear. The visualized skeletal structures are unremarkable.  IMPRESSION: No active cardiopulmonary disease.   Electronically Signed   By: Ellery Plunkaniel R Mitchell M.D.   On: 01/27/2015 01:13     EKG Interpretation   Date/Time:  Monday January 26 2015 23:39:59 EDT Ventricular Rate:  85 PR Interval:  144 QRS Duration: 84 QT Interval:  370 QTC Calculation: 440 R Axis:   18 Text Interpretation:  Normal sinus rhythm with sinus arrhythmia Normal ECG  No old tracing to compare Confirmed by Sheralyn Pinegar  MD, Maite Burlison ([id number]) on  01/27/2015 12:02:48 AM      MDM   Final diagnoses:  Right flank pain  Musculoskeletal pain  Left-sided chest wall pain    24 yo female with right flank and left lower chest wall tenderness.  Labs, cxr, ekg all normal.  Will treat for MSK pain.    Marisa Severinlga Letticia Bhattacharyya, MD 01/27/15 [phone]

## 2015-01-27 NOTE — Discharge Instructions (Signed)
Chest Wall Pain Chest wall pain is pain felt in or around the chest bones and muscles. It may take up to 6 weeks to get better. It may take longer if you are active. Chest wall pain can happen on its own. Other times, things like germs, injury, coughing, or exercise can cause the pain. HOME CARE   Avoid activities that make you tired or cause pain. Try not to use your chest, belly (abdominal), or side muscles. Do not use heavy weights.  Put ice on the sore area.  Put ice in a plastic bag.  Place a towel between your skin and the bag.  Leave the ice on for 15-20 minutes for the first 2 days.  Only take medicine as told by your doctor. GET HELP RIGHT AWAY IF:   You have more pain or are very uncomfortable.  You have a fever.  Your chest pain gets worse.  You have new problems.  You feel sick to your stomach (nauseous) or throw up (vomit).  You start to sweat or feel lightheaded.  You have a cough with mucus (phlegm).  You cough up blood. MAKE SURE YOU:   Understand these instructions.  Will watch your condition.  Will get help right away if you are not doing well or get worse. Document Released: 04/18/2008 Document Revised: 01/23/2012 Document Reviewed: 06/27/2011 [facility] Patient Information 2015 [place], [place]. This information is not intended to replace advice given to you by your health care provider. Make sure you discuss any questions you have with your health care provider.  Flank Pain Flank pain is pain in your side. The flank is the area of your side between your upper belly (abdomen) and your back. Pain in this area can be caused by many different things. HOME CARE Home care and treatment will depend on the cause of your pain.  Rest as told by your doctor.  Drink enough fluids to keep your pee (urine) clear or pale yellow.  Only take medicine as told by your doctor.  Tell your doctor about any changes in your pain.  Follow up with your doctor. GET  HELP RIGHT AWAY IF:   Your pain does not get better with medicine.   You have new symptoms or your symptoms get worse.  Your pain gets worse.   You have belly (abdominal) pain.   You are short of breath.   You always feel sick to your stomach (nauseous).   You keep throwing up (vomiting).   You have puffiness (swelling) in your belly.   You feel light-headed or you pass out (faint).   You have blood in your pee.  You have a fever or lasting symptoms for more than 2-3 days.  You have a fever and your symptoms suddenly get worse. MAKE SURE YOU:   Understand these instructions.  Will watch your condition.  Will get help right away if you are not doing well or get worse. Document Released: 08/09/2008 Document Revised: 03/17/2014 Document Reviewed: 06/14/2012 [facility] Patient Information 2015 [place], [place]. This information is not intended to replace advice given to you by your health care provider. Make sure you discuss any questions you have with your health care provider.  Musculoskeletal Pain Musculoskeletal pain is muscle and boney aches and pains. These pains can occur in any part of the body. Your caregiver may treat you without knowing the cause of the pain. They may treat you if blood or urine tests, X-rays, and other tests were normal.  CAUSES  There is often not a definite cause or reason for these pains. These pains may be caused by a type of germ (virus). The discomfort may also come from overuse. Overuse includes working out too hard when your body is not fit. Boney aches also come from weather changes. Bone is sensitive to atmospheric pressure changes. HOME CARE INSTRUCTIONS   Ask when your test results will be ready. Make sure you get your test results.  Only take over-the-counter or prescription medicines for pain, discomfort, or fever as directed by your caregiver. If you were given medications for your condition, do not drive, operate machinery or  power tools, or sign legal documents for 24 hours. Do not drink alcohol. Do not take sleeping pills or other medications that may interfere with treatment.  Continue all activities unless the activities cause more pain. When the pain lessens, slowly resume normal activities. Gradually increase the intensity and duration of the activities or exercise.  During periods of severe pain, bed rest may be helpful. Lay or sit in any position that is comfortable.  Putting ice on the injured area.  Put ice in a bag.  Place a towel between your skin and the bag.  Leave the ice on for 15 to 20 minutes, 3 to 4 times a day.  Follow up with your caregiver for continued problems and no reason can be found for the pain. If the pain becomes worse or does not go away, it may be necessary to repeat tests or do additional testing. Your caregiver may need to look further for a possible cause. SEEK IMMEDIATE MEDICAL CARE IF:  You have pain that is getting worse and is not relieved by medications.  You develop chest pain that is associated with shortness or breath, sweating, feeling sick to your stomach (nauseous), or throw up (vomit).  Your pain becomes localized to the abdomen.  You develop any new symptoms that seem different or that concern you. MAKE SURE YOU:   Understand these instructions.  Will watch your condition.  Will get help right away if you are not doing well or get worse. Document Released: 10/31/2005 Document Revised: 01/23/2012 Document Reviewed: 07/05/2013 [facility] Patient Information 2015 [place], [place]. This information is not intended to replace advice given to you by your health care provider. Make sure you discuss any questions you have with your health care provider.

## 2015-10-12 ENCOUNTER — Emergency Department (HOSPITAL_BASED_OUTPATIENT_CLINIC_OR_DEPARTMENT_OTHER): Payer: No Typology Code available for payment source

## 2015-10-12 ENCOUNTER — Encounter (HOSPITAL_BASED_OUTPATIENT_CLINIC_OR_DEPARTMENT_OTHER): Payer: Self-pay

## 2015-10-12 ENCOUNTER — Emergency Department (HOSPITAL_BASED_OUTPATIENT_CLINIC_OR_DEPARTMENT_OTHER)
Admission: EM | Admit: 2015-10-12 | Discharge: 2015-10-12 | Disposition: A | Discharge status: Home / Self Care | Payer: No Typology Code available for payment source | Attending: Emergency Medicine | Admitting: Emergency Medicine

## 2015-10-12 DIAGNOSIS — R059 Cough, unspecified: Secondary | ICD-10-CM

## 2015-10-12 DIAGNOSIS — F17200 Nicotine dependence, unspecified, uncomplicated: Secondary | ICD-10-CM | POA: Insufficient documentation

## 2015-10-12 DIAGNOSIS — J029 Acute pharyngitis, unspecified: Secondary | ICD-10-CM

## 2015-10-12 DIAGNOSIS — R079 Chest pain, unspecified: Secondary | ICD-10-CM

## 2015-10-12 DIAGNOSIS — R51 Headache: Secondary | ICD-10-CM | POA: Insufficient documentation

## 2015-10-12 DIAGNOSIS — R0789 Other chest pain: Secondary | ICD-10-CM | POA: Insufficient documentation

## 2015-10-12 DIAGNOSIS — R05 Cough: Secondary | ICD-10-CM

## 2015-10-12 LAB — RAPID STREP SCREEN (MED CTR MEBANE ONLY): Streptococcus, Group A Screen (Direct): NEGATIVE

## 2015-10-12 MED ORDER — IBUPROFEN 800 MG PO TABS: 800.0000 mg | ORAL_TABLET | Freq: Three times a day (TID) | ORAL | Status: AC

## 2015-10-12 NOTE — ED Notes (Signed)
C/o sore throat, HA x 5 days-CP x "months"-NAD

## 2015-10-12 NOTE — Discharge Instructions (Signed)
Chest Wall Pain Chest wall pain is pain in or around the bones and muscles of your chest. Sometimes, an injury causes this pain. Sometimes, the cause may not be known. This pain may take several weeks or longer to get better. HOME CARE INSTRUCTIONS  Pay attention to any changes in your symptoms. Take these actions to help with your pain:   Rest as told by your health care provider.   Avoid activities that cause pain. These include any activities that use your chest muscles or your abdominal and side muscles to lift heavy items.   If directed, apply ice to the painful area:  Put ice in a plastic bag.  Place a towel between your skin and the bag.  Leave the ice on for 20 minutes, 2-3 times per day.  Take over-the-counter and prescription medicines only as told by your health care provider.  Do not use tobacco products, including cigarettes, chewing tobacco, and e-cigarettes. If you need help quitting, ask your health care provider.  Keep all follow-up visits as told by your health care provider. This is important. SEEK MEDICAL CARE IF:  You have a fever.  Your chest pain becomes worse.  You have new symptoms. SEEK IMMEDIATE MEDICAL CARE IF:  You have nausea or vomiting.  You feel sweaty or light-headed.  You have a cough with phlegm (sputum) or you cough up blood.  You develop shortness of breath.   This information is not intended to replace advice given to you by your health care provider. Make sure you discuss any questions you have with your health care provider.   Document Released: 10/31/2005 Document Revised: 07/22/2015 Document Reviewed: 01/26/2015 Elsevier Interactive Patient Education 2016 [employer] Upper Respiratory Infection, Adult Most upper respiratory infections (URIs) are a viral infection of the air passages leading to the lungs. A URI affects the nose, throat, and upper air passages. The most common type of URI is nasopharyngitis and is typically  referred to as "the common cold." URIs run their course and usually go away on their own. Most of the time, a URI does not require medical attention, but sometimes a bacterial infection in the upper airways can follow a viral infection. This is called a secondary infection. Sinus and middle ear infections are common types of secondary upper respiratory infections. Bacterial pneumonia can also complicate a URI. A URI can worsen asthma and chronic obstructive pulmonary disease (COPD). Sometimes, these complications can require emergency medical care and may be life threatening.  CAUSES Almost all URIs are caused by viruses. A virus is a type of germ and can spread from one person to another.  RISKS FACTORS You may be at risk for a URI if:   You smoke.   You have chronic heart or lung disease.  You have a weakened defense (immune) system.   You are very young or very old.   You have nasal allergies or asthma.  You work in crowded or poorly ventilated areas.  You work in health care facilities or schools. SIGNS AND SYMPTOMS  Symptoms typically develop 2-3 days after you come in contact with a cold virus. Most viral URIs last 7-10 days. However, viral URIs from the influenza virus (flu virus) can last 14-18 days and are typically more severe. Symptoms may include:   Runny or stuffy (congested) nose.   Sneezing.   Cough.   Sore throat.   Headache.   Fatigue.   Fever.   Loss of appetite.   Pain in  your forehead, behind your eyes, and over your cheekbones (sinus pain).  Muscle aches.  DIAGNOSIS  Your health care provider may diagnose a URI by:  Physical exam.  Tests to check that your symptoms are not due to another condition such as:  Strep throat.  Sinusitis.  Pneumonia.  Asthma. TREATMENT  A URI goes away on its own with time. It cannot be cured with medicines, but medicines may be prescribed or recommended to relieve symptoms. Medicines may  help:  Reduce your fever.  Reduce your cough.  Relieve nasal congestion. HOME CARE INSTRUCTIONS   Take medicines only as directed by your health care provider.   Gargle warm saltwater or take cough drops to comfort your throat as directed by your health care provider.  Use a warm mist humidifier or inhale steam from a shower to increase air moisture. This may make it easier to breathe.  Drink enough fluid to keep your urine clear or pale yellow.   Eat soups and other clear broths and maintain good nutrition.   Rest as needed.   Return to work when your temperature has returned to normal or as your health care provider advises. You may need to stay home longer to avoid infecting others. You can also use a face mask and careful hand washing to prevent spread of the virus.  Increase the usage of your inhaler if you have asthma.   Do not use any tobacco products, including cigarettes, chewing tobacco, or electronic cigarettes. If you need help quitting, ask your health care provider. PREVENTION  The best way to protect yourself from getting a cold is to practice good hygiene.   Avoid oral or hand contact with people with cold symptoms.   Wash your hands often if contact occurs.  There is no clear evidence that vitamin C, vitamin E, echinacea, or exercise reduces the chance of developing a cold. However, it is always recommended to get plenty of rest, exercise, and practice good nutrition.  SEEK MEDICAL CARE IF:   You are getting worse rather than better.   Your symptoms are not controlled by medicine.   You have chills.  You have worsening shortness of breath.  You have brown or red mucus.  You have yellow or brown nasal discharge.  You have pain in your face, especially when you bend forward.  You have a fever.  You have swollen neck glands.  You have pain while swallowing.  You have white areas in the back of your throat. SEEK IMMEDIATE MEDICAL CARE IF:    You have severe or persistent:  Headache.  Ear pain.  Sinus pain.  Chest pain.  You have chronic lung disease and any of the following:  Wheezing.  Prolonged cough.  Coughing up blood.  A change in your usual mucus.  You have a stiff neck.  You have changes in your:  Vision.  Hearing.  Thinking.  Mood. MAKE SURE YOU:   Understand these instructions.  Will watch your condition.  Will get help right away if you are not doing well or get worse.   This information is not intended to replace advice given to you by your health care provider. Make sure you discuss any questions you have with your health care provider.   Document Released: 04/26/2001 Document Revised: 03/17/2015 Document Reviewed: 02/05/2014 Elsevier Interactive Patient Education 2016 [employer] Pharyngitis Pharyngitis is redness, pain, and swelling (inflammation) of your pharynx.  CAUSES  Pharyngitis is usually caused by infection. Most of  the time, these infections are from viruses (viral) and are part of a cold. However, sometimes pharyngitis is caused by bacteria (bacterial). Pharyngitis can also be caused by allergies. Viral pharyngitis may be spread from person to person by coughing, sneezing, and personal items or utensils (cups, forks, spoons, toothbrushes). Bacterial pharyngitis may be spread from person to person by more intimate contact, such as kissing.  SIGNS AND SYMPTOMS  Symptoms of pharyngitis include:   Sore throat.   Tiredness (fatigue).   Low-grade fever.   Headache.  Joint pain and muscle aches.  Skin rashes.  Swollen lymph nodes.  Plaque-like film on throat or tonsils (often seen with bacterial pharyngitis). DIAGNOSIS  Your health care provider will ask you questions about your illness and your symptoms. Your medical history, along with a physical exam, is often all that is needed to diagnose pharyngitis. Sometimes, a rapid strep test is done. Other lab tests  may also be done, depending on the suspected cause.  TREATMENT  Viral pharyngitis will usually get better in 3-4 days without the use of medicine. Bacterial pharyngitis is treated with medicines that kill germs (antibiotics).  HOME CARE INSTRUCTIONS   Drink enough water and fluids to keep your urine clear or pale yellow.   Only take over-the-counter or prescription medicines as directed by your health care provider:   If you are prescribed antibiotics, make sure you finish them even if you start to feel better.   Do not take aspirin.   Get lots of rest.   Gargle with 8 oz of salt water ( tsp of salt per 1 qt of water) as often as every 1-2 hours to soothe your throat.   Throat lozenges (if you are not at risk for choking) or sprays may be used to soothe your throat. SEEK MEDICAL CARE IF:   You have large, tender lumps in your neck.  You have a rash.  You cough up green, yellow-brown, or bloody spit. SEEK IMMEDIATE MEDICAL CARE IF:   Your neck becomes stiff.  You drool or are unable to swallow liquids.  You vomit or are unable to keep medicines or liquids down.  You have severe pain that does not go away with the use of recommended medicines.  You have trouble breathing (not caused by a stuffy nose). MAKE SURE YOU:   Understand these instructions.  Will watch your condition.  Will get help right away if you are not doing well or get worse.   This information is not intended to replace advice given to you by your health care provider. Make sure you discuss any questions you have with your health care provider.   Document Released: 10/31/2005 Document Revised: 08/21/2013 Document Reviewed: 07/08/2013 Elsevier Interactive Patient Education [employer]

## 2015-10-12 NOTE — ED Provider Notes (Signed)
CSN: [id number]     Arrival date & time 10/12/15  1139 History   First MD Initiated Contact with Patient 10/12/15 1147     Chief Complaint  Patient presents with  . Sore Throat     (Consider location/radiation/quality/duration/timing/severity/associated sxs/prior Treatment) HPI Comments: Patient presents to the emergency department with chief complaint of sore throat. She states that she has had a sore throat 5 days. She denies any aggravating or alleviating factors. She denies any fevers chills. She reports associated headache. Additionally, she states that she has had some anterior chest pain times several months. She reports associated cough. She denies any shortness breath. Denies any history of ACS, PE, or DVT. She denies any recent long travel, immobilization. Her chest pain is worsened with palpation. She has not tried anything to alleviate her symptoms. She has not seen her primary care provider for this.  The history is provided by the patient. No language interpreter was used.    History reviewed. No pertinent past medical history. Past Surgical History  Procedure Laterality Date  . Mouth surgery     No family history on file. Social History  Substance Use Topics  . Smoking status: Current Every Day Smoker -- 0.50 packs/day  . Smokeless tobacco: None  . Alcohol Use: No   OB History    No data available     Review of Systems  Constitutional: Negative for fever and chills.  HENT: Positive for sore throat. Negative for postnasal drip, rhinorrhea, sinus pressure and sneezing.   Respiratory: Positive for cough. Negative for shortness of breath.   Cardiovascular: Negative for chest pain.  Gastrointestinal: Negative for nausea, vomiting, abdominal pain, diarrhea and constipation.  Genitourinary: Negative for dysuria.  All other systems reviewed and are negative.     Allergies  Review of patient's allergies indicates no known allergies.  Home Medications   Prior to  Admission medications   Not on File   BP 145/89 mmHg  Pulse 79  Temp(Src) 98.7 F (37.1 C) (Oral)  Resp 18  Ht  (1.676 m)  Wt 88.451 kg  BMI 31.49 kg/m2  SpO2 100% Physical Exam  Constitutional: She is oriented to person, place, and time. She appears well-developed and well-nourished.  HENT:  Head: Normocephalic and atraumatic.  Oropharynx is mildly erythematous, no tonsillar exudates, uvula is midline, airway is intact  Eyes: Conjunctivae and EOM are normal. Pupils are equal, round, and reactive to light.  Neck: Normal range of motion. Neck supple.  Cardiovascular: Normal rate and regular rhythm.  Exam reveals no gallop and no friction rub.   No murmur heard. Pulmonary/Chest: Effort normal and breath sounds normal. No respiratory distress. She has no wheezes. She has no rales. She exhibits tenderness.  Anterior chest wall tender to palpation, no bruising, clear to auscultation bilaterally  Abdominal: Soft. Bowel sounds are normal. She exhibits no distension and no mass. There is no tenderness. There is no rebound and no guarding.  Musculoskeletal: Normal range of motion. She exhibits no edema or tenderness.  tenderness, no evidence of DVT  Neurological: She is alert and oriented to person, place, and time.  Skin: Skin is warm and dry.  Psychiatric: She has a normal mood and affect. Her behavior is normal. Judgment and thought content normal.  Nursing note and vitals reviewed.   ED Course  Procedures (including critical care time) Labs Review Labs Reviewed  RAPID STREP SCREEN (NOT AT [facility])  CULTURE, GROUP A STREP    Imaging Review Dg  Chest 2 View  10/12/2015  CLINICAL DATA:  Chest pain for 2 months, dizziness and headache for 3 weeks. EXAM: CHEST  2 VIEW COMPARISON:  01/27/2015. FINDINGS: Trachea is midline. Heart size normal. Lungs are clear. No pleural fluid. IMPRESSION: No acute findings. Electronically Signed   By: Leanna BattlesMelinda  Blietz M.D.   On: 10/12/2015 12:53   I  have personally reviewed and evaluated these images and lab results as part of my medical decision-making.    MDM   Final diagnoses:  Cough  Chest pain  Pharyngitis    Patient with multiple complaints. Primarily complains of sore throat today, onset 5 days ago. Oropharynx is mildly erythematous, no abscess. Will check rapid strep test.  Patient also complains of chest pain times several months. The chest pain as worsened with palpation of her anterior chest. She has no shortness of breath, no wheezing, no fever. No history of PE or DVT. No history of ACS. No recent travel, and no lower extremity swelling. Low risk for PE. Doubt ACS. Will check chest x-ray for further evaluation. If negative, I suspect the patient may follow-up with her primary care provider for further evaluation and care. Doubt any emergent process. Vital signs stable, pulse oxygenation is 100%, heart rate 79, temp 98.7.    Roxy Horsemanobert Seanpaul Preece, PA-C 10/12/15 1328  Melene Planan Floyd, DO 10/12/15 1527

## 2015-10-14 LAB — CULTURE, GROUP A STREP: Strep A Culture: NEGATIVE

## 2015-11-17 ENCOUNTER — Emergency Department (HOSPITAL_BASED_OUTPATIENT_CLINIC_OR_DEPARTMENT_OTHER)
Admission: EM | Admit: 2015-11-17 | Discharge: 2015-11-17 | Disposition: A | Discharge status: Home / Self Care | Payer: No Typology Code available for payment source | Attending: Emergency Medicine | Admitting: Emergency Medicine

## 2015-11-17 ENCOUNTER — Encounter (HOSPITAL_BASED_OUTPATIENT_CLINIC_OR_DEPARTMENT_OTHER): Payer: Self-pay | Admitting: *Deleted

## 2015-11-17 DIAGNOSIS — Z791 Long term (current) use of non-steroidal anti-inflammatories (NSAID): Secondary | ICD-10-CM | POA: Insufficient documentation

## 2015-11-17 DIAGNOSIS — Z3202 Encounter for pregnancy test, result negative: Secondary | ICD-10-CM | POA: Insufficient documentation

## 2015-11-17 DIAGNOSIS — M545 Low back pain, unspecified: Secondary | ICD-10-CM

## 2015-11-17 DIAGNOSIS — G8929 Other chronic pain: Secondary | ICD-10-CM | POA: Insufficient documentation

## 2015-11-17 DIAGNOSIS — F17200 Nicotine dependence, unspecified, uncomplicated: Secondary | ICD-10-CM | POA: Insufficient documentation

## 2015-11-17 LAB — URINALYSIS, ROUTINE W REFLEX MICROSCOPIC
Bilirubin Urine: NEGATIVE
Glucose, UA: NEGATIVE mg/dL
Hgb urine dipstick: NEGATIVE
Ketones, ur: NEGATIVE mg/dL
Leukocytes, UA: NEGATIVE
Nitrite: NEGATIVE
Protein, ur: NEGATIVE mg/dL
Specific Gravity, Urine: 1.022 (ref 1.005–1.030)
pH: 6.5 (ref 5.0–8.0)

## 2015-11-17 LAB — PREGNANCY, URINE: Preg Test, Ur: NEGATIVE

## 2015-11-17 MED ORDER — PREDNISONE 20 MG PO TABS: ORAL_TABLET | ORAL | Status: AC

## 2015-11-17 MED ORDER — ORPHENADRINE CITRATE ER 100 MG PO TB12: 100.0000 mg | ORAL_TABLET | Freq: Two times a day (BID) | ORAL | Status: AC

## 2015-11-17 NOTE — Discharge Instructions (Signed)
Back Injury Prevention °Back injuries can be very painful. They can also be difficult to heal. After having one back injury, you are more likely to injure your back again. It is important to learn how to avoid injuring or re-injuring your back. The following tips can help you to prevent a back injury. °WHAT SHOULD I KNOW ABOUT PHYSICAL FITNESS? °1. Exercise for 30 minutes per day on most days of the week or as directed by your health care provider. Make sure to: °1. Do aerobic exercises, such as walking, jogging, biking, or swimming. °2. Do exercises that increase balance and strength, such as tai chi and yoga. These can decrease your risk of falling and injuring your back. °3. Do stretching exercises to help with flexibility. °4. Try to develop strong abdominal muscles. Your abdominal muscles provide a lot of the support that is needed by your back. °2. Maintain a healthy weight.  This helps to decrease your risk of a back injury. °WHAT SHOULD I KNOW ABOUT MY DIET? °1. Talk with your health care provider about your overall diet. Take supplements and vitamins only as directed by your health care provider. °2. Talk with your health care provider about how much calcium and vitamin D you need each day. These nutrients help to prevent weakening of the bones (osteoporosis). Osteoporosis can cause broken (fractured) bones, which lead to back pain. °3. Include good sources of calcium in your diet, such as dairy products, green leafy vegetables, and products that have had calcium added to them (fortified). °4. Include good sources of vitamin D in your diet, such as milk and foods that are fortified with vitamin D. °WHAT SHOULD I KNOW ABOUT MY POSTURE? °1. Sit up straight and stand up straight. Avoid leaning forward when you sit or hunching over when you stand. °2. Choose chairs that have good low-back (lumbar) support. °3. If you work at a desk, sit close to it so you do not need to lean over. Keep your chin tucked in. Keep  your neck drawn back, and keep your elbows bent at a right angle. Your arms should look like the letter "L." °4. Sit high and close to the steering wheel when you drive. Add a lumbar support to your car seat, if needed. °5. Avoid sitting or standing in one position for very long. Take breaks to get up, stretch, and walk around at least one time every hour. Take breaks every hour if you are driving for long periods of time. °6. Sleep on your side with your knees slightly bent, or sleep on your back with a pillow under your knees. Do not lie on the front of your body to sleep. °WHAT SHOULD I KNOW ABOUT LIFTING, TWISTING, AND REACHING? °Lifting and Heavy Lifting °1. Avoid heavy lifting, especially repetitive heavy lifting. If you must do heavy lifting: °1. Stretch before lifting. °2. Work slowly. °3. Rest between lifts. °4. Use a tool such as a cart or a dolly to move objects if one is available. °5. Make several small trips instead of carrying one heavy load. °6. Ask for help when you need it, especially when moving big objects. °2. Follow these steps when lifting: °1. Stand with your feet shoulder-width apart. °2. Get as close to the object as you can. Do not try to pick up a heavy object that is far from your body. °3. Use handles or lifting straps if they are available. °4. Bend at your knees. Squat down, but keep your heels off the floor. °  5. Keep your shoulders pulled back, your chin tucked in, and your back straight. °6. Lift the object slowly while you tighten the muscles in your legs, abdomen, and buttocks. Keep the object as close to the center of your body as possible. °3. Follow these steps when putting down a heavy load: °1. Stand with your feet shoulder-width apart. °2. Lower the object slowly while you tighten the muscles in your legs, abdomen, and buttocks. Keep the object as close to the center of your body as possible. °3. Keep your shoulders pulled back, your chin tucked in, and your back  straight. °4. Bend at your knees. Squat down, but keep your heels off the floor. °5. Use handles or lifting straps if they are available. °Twisting and Reaching °1. Avoid lifting heavy objects above your waist. °2. Do not twist at your waist while you are lifting or carrying a load. If you need to turn, move your feet. °3. Do not bend over without bending at your knees. °4. Avoid reaching over your head, across a table, or for an object on a high surface. °WHAT ARE SOME OTHER TIPS? °1. Avoid wet floors and icy ground. Keep sidewalks clear of ice to prevent falls. °2. Do not sleep on a mattress that is too soft or too hard. °3. Keep items that are used frequently within easy reach. °4. Put heavier objects on shelves at waist level, and put lighter objects on lower or higher shelves. °5. Find ways to decrease your stress, such as exercise, massage, or relaxation techniques. Stress can build up in your muscles. Tense muscles are more vulnerable to injury. °6. Talk with your health care provider if you feel anxious or depressed. These conditions can make back pain worse. °7. Wear flat heel shoes with cushioned soles. °8. Avoid sudden movements. °9. Use both shoulder straps when carrying a backpack. °10. Do not use any tobacco products, including cigarettes, chewing tobacco, or electronic cigarettes. If you need help quitting, ask your health care provider. °  °This information is not intended to replace advice given to you by your health care provider. Make sure you discuss any questions you have with your health care provider. °  °Document Released: 12/08/2004 Document Revised: 03/17/2015 Document Reviewed: 11/04/2014 °Elsevier Interactive Patient Education ©2016 [employer] ° °Back Exercises °The following exercises strengthen the muscles that help to support the back. They also help to keep the lower back flexible. Doing these exercises can help to prevent back pain or lessen existing pain. °If you have back pain  or discomfort, try doing these exercises 2-3 times each day or as told by your health care provider. When the pain goes away, do them once each day, but increase the number of times that you repeat the steps for each exercise (do more repetitions). If you do not have back pain or discomfort, do these exercises once each day or as told by your health care provider. °EXERCISES °Single Knee to Chest °Repeat these steps 3-5 times for each leg: °3. Lie on your back on a firm bed or the floor with your legs extended. °4. Bring one knee to your chest. Your other leg should stay extended and in contact with the floor. °5. Hold your knee in place by grabbing your knee or thigh. °6. Pull on your knee until you feel a gentle stretch in your lower back. °7. Hold the stretch for 10-30 seconds. °8. Slowly release and straighten your leg. °Pelvic Tilt °Repeat these steps 5-10 times: °  5. Lie on your back on a firm bed or the floor with your legs extended. °6. Bend your knees so they are pointing toward the ceiling and your feet are flat on the floor. °7. Tighten your lower abdominal muscles to press your lower back against the floor. This motion will tilt your pelvis so your tailbone points up toward the ceiling instead of pointing to your feet or the floor. °8. With gentle tension and even breathing, hold this position for 5-10 seconds. °Cat-Cow °Repeat these steps until your lower back becomes more flexible: °7. Get into a hands-and-knees position on a firm surface. Keep your hands under your shoulders, and keep your knees under your hips. You may place padding under your knees for comfort. °8. Let your head hang down, and point your tailbone toward the floor so your lower back becomes rounded like the back of a cat. °9. Hold this position for 5 seconds. °10. Slowly lift your head and point your tailbone up toward the ceiling so your back forms a sagging arch like the back of a cow. °11. Hold this position for 5  seconds. °Press-Ups °Repeat these steps 5-10 times: °4. Lie on your abdomen (face-down) on the floor. °5. Place your palms near your head, about shoulder-width apart. °6. While you keep your back as relaxed as possible and keep your hips on the floor, slowly straighten your arms to raise the top half of your body and lift your shoulders. Do not use your back muscles to raise your upper torso. You may adjust the placement of your hands to make yourself more comfortable. °7. Hold this position for 5 seconds while you keep your back relaxed. °8. Slowly return to lying flat on the floor. °Bridges °Repeat these steps 10 times: °5. Lie on your back on a firm surface. °6. Bend your knees so they are pointing toward the ceiling and your feet are flat on the floor. °7. Tighten your buttocks muscles and lift your buttocks off of the floor until your waist is at almost the same height as your knees. You should feel the muscles working in your buttocks and the back of your thighs. If you do not feel these muscles, slide your feet 1-2 inches farther away from your buttocks. °8. Hold this position for 3-5 seconds. °9. Slowly lower your hips to the starting position, and allow your buttocks muscles to relax completely. °If this exercise is too easy, try doing it with your arms crossed over your chest. °Abdominal Crunches °Repeat these steps 5-10 times: °11. Lie on your back on a firm bed or the floor with your legs extended. °12. Bend your knees so they are pointing toward the ceiling and your feet are flat on the floor. °13. Cross your arms over your chest. °14. Tip your chin slightly toward your chest without bending your neck. °15. Tighten your abdominal muscles and slowly raise your trunk (torso) high enough to lift your shoulder blades a tiny bit off of the floor. Avoid raising your torso higher than that, because it can put too much stress on your low back and it does not help to strengthen your abdominal  muscles. °16. Slowly return to your starting position. °Back Lifts °Repeat these steps 5-10 times: °1. Lie on your abdomen (face-down) with your arms at your sides, and rest your forehead on the floor. °2. Tighten the muscles in your legs and your buttocks. °3. Slowly lift your chest off of the floor while you keep your   hips pressed to the floor. Keep the back of your head in line with the curve in your back. Your eyes should be looking at the floor. 4. Hold this position for 3-5 seconds. 5. Slowly return to your starting position. SEEK MEDICAL CARE IF:  Your back pain or discomfort gets much worse when you do an exercise.  Your back pain or discomfort does not lessen within 2 hours after you exercise. If you have any of these problems, stop doing these exercises right away. Do not do them again unless your health care provider says that you can. SEEK IMMEDIATE MEDICAL CARE IF:  You develop sudden, severe back pain. If this happens, stop doing the exercises right away. Do not do them again unless your health care provider says that you can.   This information is not intended to replace advice given to you by your health care provider. Make sure you discuss any questions you have with your health care provider.   Document Released: 12/08/2004 Document Revised: 07/22/2015 Document Reviewed: 12/25/2014 Elsevier Interactive Patient Education [employer].

## 2015-11-17 NOTE — ED Notes (Signed)
Back pain and chills. States she has chronic pain with hx of slipped disc.

## 2015-11-17 NOTE — ED Provider Notes (Signed)
CSN: [id number]     Arrival date & time 11/17/15  1335 History   By signing my name below, I, Arlan Organ, attest that this documentation has been prepared under the direction and in the presence of Arby Barrette, MD.  Electronically Signed: Arlan Organ, ED Scribe. 11/17/2015. 6:21 PM.   Chief Complaint  Patient presents with  . Back Pain   The history is provided by the patient. No language interpreter was used.    HPI Comments: Anita Rush is a 25 y.o. female with a PMHx of a "slipped disc" who presents to the Emergency Department complaining of constant, ongoing, chronic in nature lower back pain worsened in the last 2 months. Pain is exacerbated when sleeping and with certain positions. No alleviating factors at this time. Prescribed Flexeril and OTC Naproxen attempted prior to arrival without any improvement. No recent fever, chills, nausea, vomiting. No weakness, loss of sensation, or numbness.  PCP: No primary care provider on file.    History reviewed. No pertinent past medical history. Past Surgical History  Procedure Laterality Date  . Mouth surgery     No family history on file. Social History  Substance Use Topics  . Smoking status: Current Every Day Smoker -- 0.50 packs/day  . Smokeless tobacco: None  . Alcohol Use: No   OB History    No data available     Review of Systems  Constitutional: Negative for fever and chills.  Respiratory: Negative for cough.   Cardiovascular: Negative for chest pain.  Gastrointestinal: Negative for nausea, vomiting and abdominal pain.  Musculoskeletal: Positive for back pain.  Neurological: Negative for weakness and numbness.  Psychiatric/Behavioral: Negative for confusion.  All other systems reviewed and are negative.     Allergies  Review of patient's allergies indicates no known allergies.  Home Medications   Prior to Admission medications   Medication Sig Start Date End Date Taking? Authorizing Provider  ibuprofen  (ADVIL,MOTRIN) 800 MG tablet Take 1 tablet (800 mg total) by mouth 3 (three) times daily. 10/12/15   Roxy Horseman, PA-C  orphenadrine (NORFLEX) 100 MG tablet Take 1 tablet (100 mg total) by mouth 2 (two) times daily. 11/17/15   Arby Barrette, MD  predniSONE (DELTASONE) 20 MG tablet 3 tabs po daily x 3 days, then 2 tabs x 3 days, then 1.5 tabs x 3 days, then 1 tab x 3 days, then 0.5 tabs x 3 days 11/17/15   Arby Barrette, MD   Triage Vitals: BP 132/86 mmHg  Pulse 72  Temp(Src) 98.2 F (36.8 C) (Oral)  Resp 20  Ht 5\' 6"  (1.676 m)  Wt 205 lb (92.987 kg)  BMI 33.10 kg/m2  SpO2 100%   Physical Exam  Constitutional: She is oriented to person, place, and time. She appears well-developed and well-nourished.  HENT:  Head: Normocephalic.  Eyes: EOM are normal.  Neck: Normal range of motion.  Pulmonary/Chest: Effort normal.  Abdominal: She exhibits no distension.  Musculoskeletal: Normal range of motion.  Patient stands and ambulates without difficulty. The patient is a low back localizes some pain to the right SI area. No rash or soft tissue abnormality.  Neurological: She is alert and oriented to person, place, and time.  Psychiatric: She has a normal mood and affect.  Nursing note and vitals reviewed.   ED Course  Procedures (including critical care time)  DIAGNOSTIC STUDIES: Oxygen Saturation is 100% on RA, Normal by my interpretation.    COORDINATION OF CARE: 6:16 PM- Will order urine pregnancy  and urinalysis. Discussed treatment plan with pt at bedside and pt agreed to plan.     6:21 PM- Will refer to Dr. Norton BlizzardShane Hudnall- Sports Medicine.  Labs Review Labs Reviewed  URINALYSIS, ROUTINE W REFLEX MICROSCOPIC (NOT [facility]) - Abnormal; Notable for the following:    APPearance CLOUDY (*)    All other components within normal limits  PREGNANCY, URINE    Imaging Review No results found. I have personally reviewed and evaluated these images and lab results as part of my medical  decision-making.   EKG Interpretation None      MDM   Final diagnoses:  Midline low back pain without sciatica   Patient has history of chronic lower back pain. She describes having undergone physical therapy and medical management. At this time there do not appear to be acute findings of disc compression or any neurologic dysfunction. Patient is otherwise well in appearance. She is counseled to follow-up with sports medicine to undergo evaluation for recurrent physical therapy versus referral to spine orthopedics. Patient be given a course of prednisone and muscle relaxers.  Arby BarretteMarcy Bartlett Enke, MD 11/17/15 [phone]

## 2016-02-05 IMAGING — CR DG CHEST 2V
2 series · 2 of 2 positions shown · non-contrast
Comparison: 01/27/2015.

CLINICAL DATA: Chest pain for 2 months, dizziness and headache for
3 weeks.

EXAM:
CHEST  2 VIEW

[w chest pa]
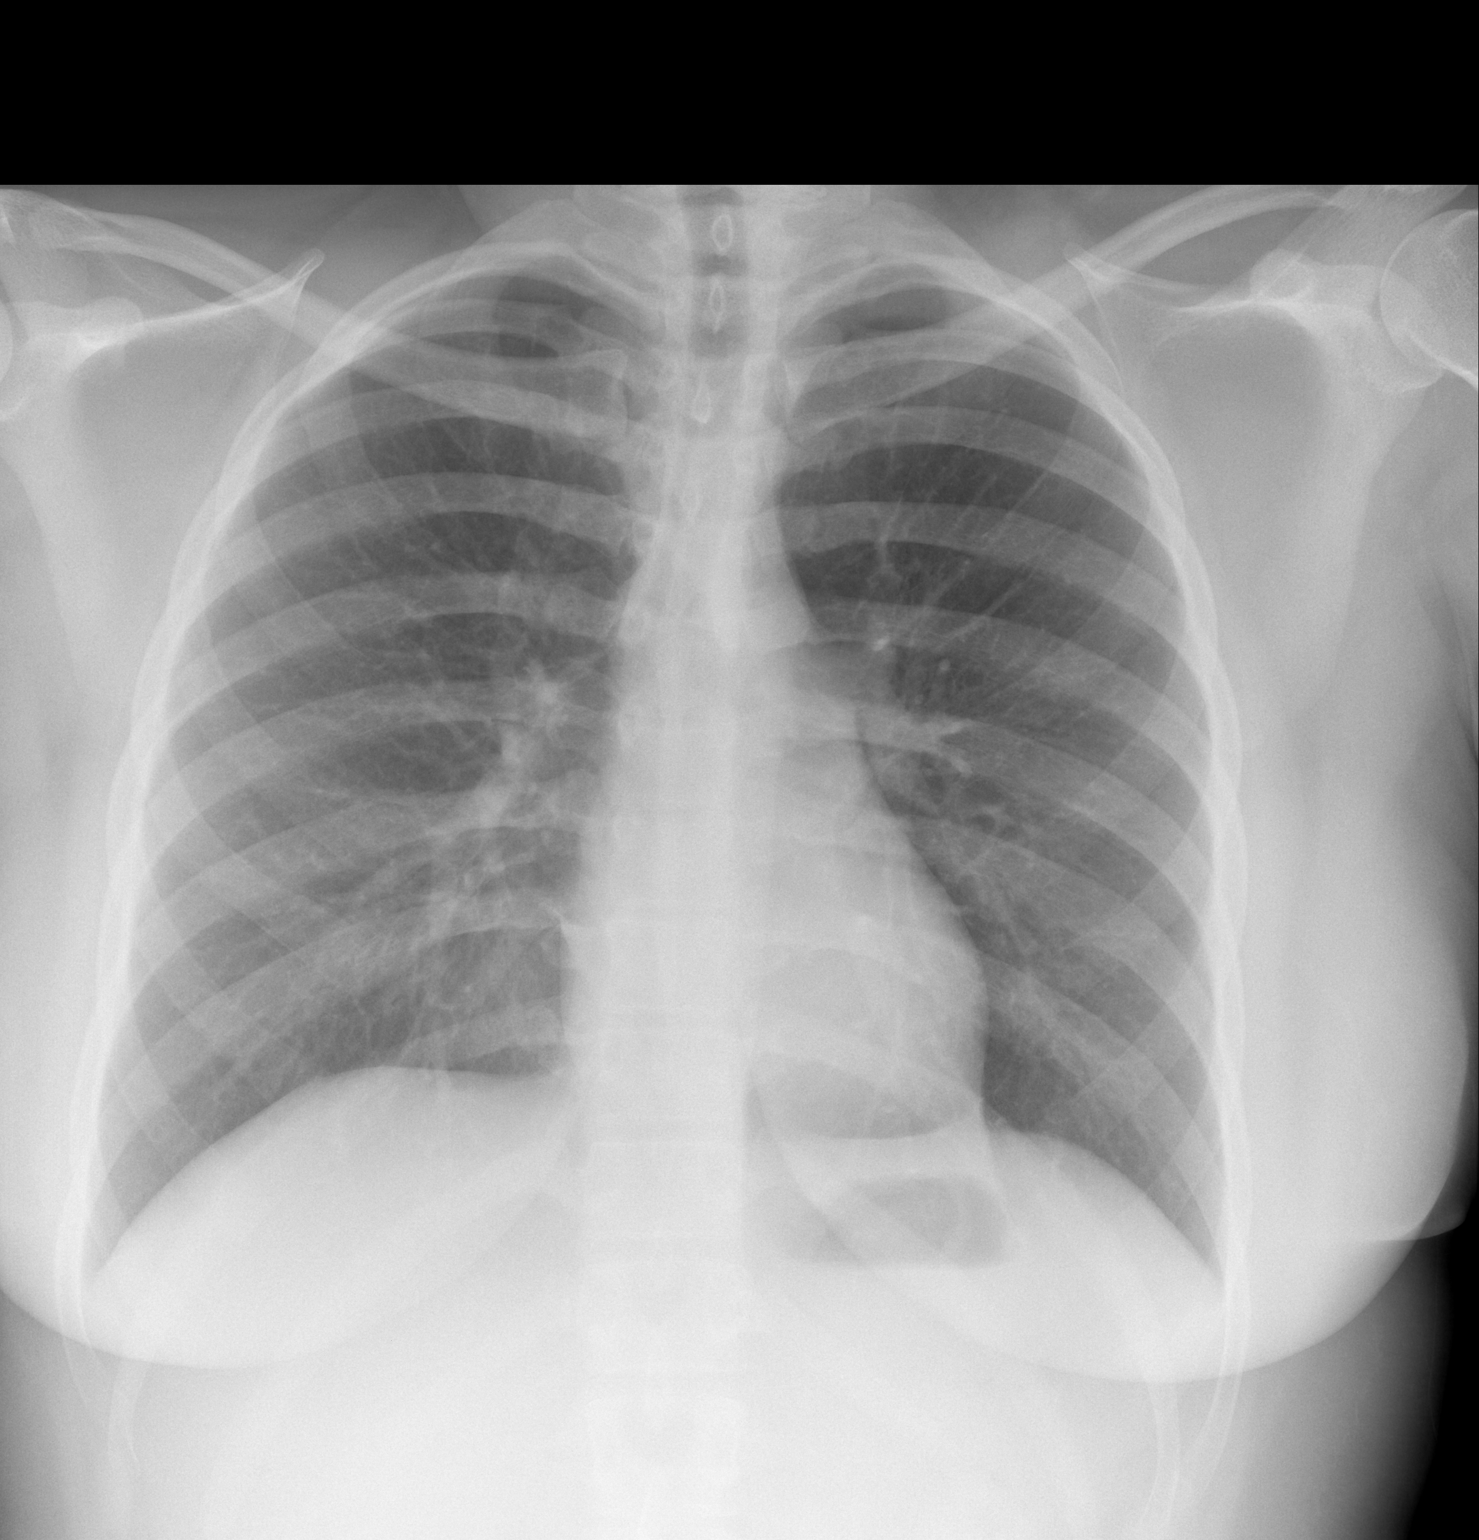

[w chest lat]
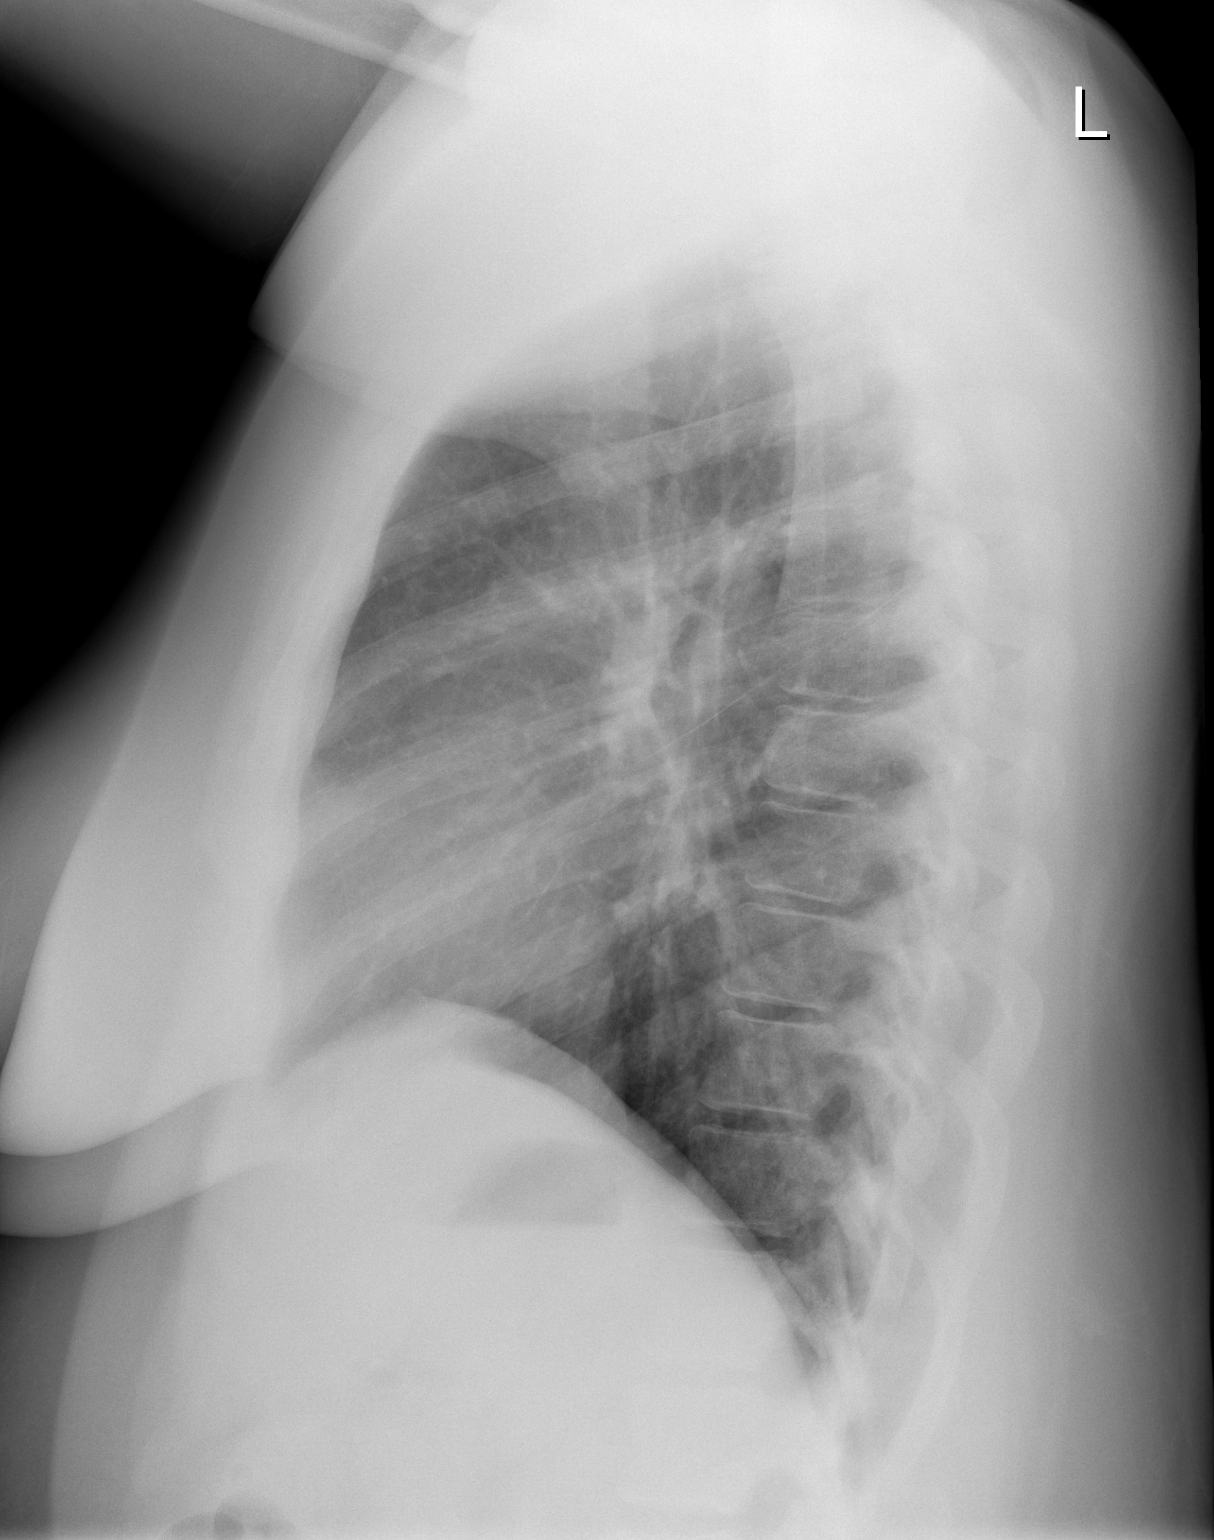

[2 of 2 positions shown; findings below may reference images not displayed]

FINDINGS: Trachea is midline. Heart size normal. Lungs are clear. No pleural
fluid.
IMPRESSION: No acute findings.

## 2018-04-13 ENCOUNTER — Other Ambulatory Visit: Payer: Self-pay | Admitting: Internal Medicine

## 2018-04-13 DIAGNOSIS — R635 Abnormal weight gain: Secondary | ICD-10-CM

## 2018-04-13 DIAGNOSIS — L680 Hirsutism: Secondary | ICD-10-CM

## 2018-04-27 ENCOUNTER — Inpatient Hospital Stay
Admission: RE | Admit: 2018-04-27 | Discharge: 2018-04-27 | Disposition: A | Discharge status: Home / Self Care | Payer: No Typology Code available for payment source | Source: Ambulatory Visit | Attending: Internal Medicine | Admitting: Internal Medicine

## 2018-06-08 ENCOUNTER — Inpatient Hospital Stay
Admission: RE | Admit: 2018-06-08 | Discharge: 2018-06-08 | Disposition: A | Discharge status: Home / Self Care | Payer: No Typology Code available for payment source | Source: Ambulatory Visit | Attending: Internal Medicine | Admitting: Internal Medicine

## 2019-03-06 ENCOUNTER — Other Ambulatory Visit: Payer: Self-pay | Admitting: Obstetrics and Gynecology

## 2020-12-06 ENCOUNTER — Encounter (HOSPITAL_BASED_OUTPATIENT_CLINIC_OR_DEPARTMENT_OTHER): Payer: Self-pay

## 2020-12-06 ENCOUNTER — Other Ambulatory Visit: Payer: Self-pay

## 2020-12-06 ENCOUNTER — Emergency Department (HOSPITAL_BASED_OUTPATIENT_CLINIC_OR_DEPARTMENT_OTHER)
Admission: EM | Admit: 2020-12-06 | Discharge: 2020-12-06 | Disposition: A | Discharge status: Home / Self Care | Payer: Managed Care, Other (non HMO) | Attending: Emergency Medicine | Admitting: Emergency Medicine

## 2020-12-06 DIAGNOSIS — R079 Chest pain, unspecified: Secondary | ICD-10-CM | POA: Insufficient documentation

## 2020-12-06 DIAGNOSIS — R1030 Lower abdominal pain, unspecified: Secondary | ICD-10-CM | POA: Insufficient documentation

## 2020-12-06 DIAGNOSIS — Z5321 Procedure and treatment not carried out due to patient leaving prior to being seen by health care provider: Secondary | ICD-10-CM | POA: Diagnosis not present

## 2020-12-06 LAB — CBC
HCT: 40.3 % (ref 36.0–46.0)
Hemoglobin: 13.9 g/dL (ref 12.0–15.0)
MCH: 30.5 pg (ref 26.0–34.0)
MCHC: 34.5 g/dL (ref 30.0–36.0)
MCV: 88.6 fL (ref 80.0–100.0)
Platelets: 195 10*3/uL (ref 150–400)
RBC: 4.55 MIL/uL (ref 3.87–5.11)
RDW: 12.7 % (ref 11.5–15.5)
WBC: 9.1 10*3/uL (ref 4.0–10.5)
nRBC: 0 % (ref 0.0–0.2)

## 2020-12-06 LAB — COMPREHENSIVE METABOLIC PANEL
ALT: 28 U/L (ref 0–44)
AST: 24 U/L (ref 15–41)
Albumin: 3.8 g/dL (ref 3.5–5.0)
Alkaline Phosphatase: 56 U/L (ref 38–126)
Anion gap: 10 (ref 5–15)
BUN: 12 mg/dL (ref 6–20)
CO2: 26 mmol/L (ref 22–32)
Calcium: 8.6 mg/dL — ABNORMAL LOW (ref 8.9–10.3)
Chloride: 101 mmol/L (ref 98–111)
Creatinine, Ser: 0.78 mg/dL (ref 0.44–1.00)
GFR, Estimated: >60 mL/min (ref >60)
Glucose, Bld: 104 mg/dL — ABNORMAL HIGH (ref 70–99)
Potassium: 3.8 mmol/L (ref 3.5–5.1)
Sodium: 137 mmol/L (ref 135–145)
Total Bilirubin: 0.3 mg/dL (ref 0.3–1.2)
Total Protein: 6.9 g/dL (ref 6.5–8.1)

## 2020-12-06 LAB — URINALYSIS, ROUTINE W REFLEX MICROSCOPIC
Bilirubin Urine: NEGATIVE
Glucose, UA: NEGATIVE mg/dL
Ketones, ur: NEGATIVE mg/dL
Nitrite: NEGATIVE
Protein, ur: NEGATIVE mg/dL
Specific Gravity, Urine: 1.02 (ref 1.005–1.030)
pH: 6.5 (ref 5.0–8.0)

## 2020-12-06 LAB — LIPASE, BLOOD: Lipase: 25 U/L (ref 11–51)

## 2020-12-06 LAB — PREGNANCY, URINE: Preg Test, Ur: NEGATIVE

## 2020-12-06 LAB — URINALYSIS, MICROSCOPIC (REFLEX)

## 2020-12-06 NOTE — ED Notes (Signed)
Pt called for VS, no answer  

## 2020-12-06 NOTE — ED Notes (Signed)
Called for PT in lobby no response

## 2020-12-06 NOTE — ED Triage Notes (Signed)
Pt c/o pain under ribs/lower chest and in abdomen as well as burning with urination. Denies N/V/D.

## 2020-12-06 NOTE — ED Notes (Signed)
Pt not present in lobby
